# Patient Record
Sex: Male | Born: 1985 | Race: Black or African American | Hispanic: No | Marital: Single | Smoking: Never smoker
Health system: Southern US, Community
[De-identification: ages and names within clinical notes are randomized; demographics above are authoritative.]

## PROBLEM LIST (undated history)

## (undated) DIAGNOSIS — F79 Unspecified intellectual disabilities: Secondary | ICD-10-CM

## (undated) DIAGNOSIS — I10 Essential (primary) hypertension: Secondary | ICD-10-CM

## (undated) DIAGNOSIS — G4733 Obstructive sleep apnea (adult) (pediatric): Secondary | ICD-10-CM

## (undated) DIAGNOSIS — F419 Anxiety disorder, unspecified: Secondary | ICD-10-CM

## (undated) DIAGNOSIS — R32 Unspecified urinary incontinence: Secondary | ICD-10-CM

## (undated) DIAGNOSIS — F909 Attention-deficit hyperactivity disorder, unspecified type: Secondary | ICD-10-CM

## (undated) HISTORY — DX: Unspecified intellectual disabilities: F79

## (undated) HISTORY — DX: Obstructive sleep apnea (adult) (pediatric): G47.33

## (undated) HISTORY — PX: NO PAST SURGERIES: SHX2092

---

## 2009-12-26 ENCOUNTER — Ambulatory Visit: Payer: Self-pay | Admitting: Internal Medicine

## 2009-12-26 DIAGNOSIS — F71 Moderate intellectual disabilities: Secondary | ICD-10-CM

## 2009-12-26 DIAGNOSIS — G4733 Obstructive sleep apnea (adult) (pediatric): Secondary | ICD-10-CM | POA: Insufficient documentation

## 2009-12-26 DIAGNOSIS — F909 Attention-deficit hyperactivity disorder, unspecified type: Secondary | ICD-10-CM | POA: Insufficient documentation

## 2010-01-02 ENCOUNTER — Encounter: Payer: Self-pay | Admitting: Internal Medicine

## 2010-01-23 ENCOUNTER — Ambulatory Visit (HOSPITAL_BASED_OUTPATIENT_CLINIC_OR_DEPARTMENT_OTHER): Admission: RE | Admit: 2010-01-23 | Discharge: 2010-01-23 | Payer: Self-pay | Admitting: Internal Medicine

## 2010-01-25 ENCOUNTER — Ambulatory Visit: Payer: Self-pay | Admitting: Internal Medicine

## 2010-02-06 ENCOUNTER — Ambulatory Visit: Payer: Self-pay | Admitting: Internal Medicine

## 2010-02-14 ENCOUNTER — Encounter: Payer: Self-pay | Admitting: Internal Medicine

## 2010-03-02 ENCOUNTER — Encounter: Payer: Self-pay | Admitting: Internal Medicine

## 2010-03-06 ENCOUNTER — Telehealth (INDEPENDENT_AMBULATORY_CARE_PROVIDER_SITE_OTHER): Payer: Self-pay | Admitting: *Deleted

## 2010-03-07 ENCOUNTER — Ambulatory Visit: Payer: Self-pay | Admitting: Internal Medicine

## 2010-03-13 ENCOUNTER — Encounter: Payer: Self-pay | Admitting: Internal Medicine

## 2010-04-19 ENCOUNTER — Encounter: Payer: Self-pay | Admitting: Internal Medicine

## 2010-06-10 ENCOUNTER — Encounter: Payer: Self-pay | Admitting: Internal Medicine

## 2010-06-12 ENCOUNTER — Ambulatory Visit: Payer: Self-pay | Admitting: Internal Medicine

## 2010-07-18 ENCOUNTER — Ambulatory Visit: Payer: Self-pay | Admitting: Internal Medicine

## 2010-07-27 ENCOUNTER — Encounter: Payer: Self-pay | Admitting: Internal Medicine

## 2010-08-03 ENCOUNTER — Encounter: Payer: Self-pay | Admitting: Internal Medicine

## 2010-08-18 ENCOUNTER — Telehealth: Payer: Self-pay | Admitting: Internal Medicine

## 2010-08-21 ENCOUNTER — Telehealth (INDEPENDENT_AMBULATORY_CARE_PROVIDER_SITE_OTHER): Payer: Self-pay | Admitting: *Deleted

## 2010-08-26 ENCOUNTER — Ambulatory Visit: Payer: Self-pay | Admitting: Internal Medicine

## 2010-10-14 NOTE — Assessment & Plan Note (Signed)
Summary: rov 1 month///kp   CC:  Follow up visit-sleep.Marland Kitchen  History of Present Illness: History of Present Illness: December 26, 2009- 23 yoM retarded, here with his mother for question of sleep apnea on kind referral by Dr Alita Chyle. His is pleasan t and cooperative, but all hx is from mother. She says husband became concerned watching "Chad Middleton" nap. He has snored loudly all his life. Husband noted apnea and they confirm daytime sleepiness. He is also treated at [facility] by psychiatrist with dx of ADHD and anxiety. There is no hx of seizures, head trauma, or cardiopulmonary disease. No active medical disease otherwise and no hx of surgery. Bedtime 830PM, up 700AM. Unsure about waking after sleep onset. No sleep med. Little caffeine.  Feb 06, 2010- OSA, mental retardation......................Marland Kitchenmother here Came to review NPSG. He did ok with his sleep testing according to mother. NPSG- mild to mod OSA AHI 8.1/ RDI 56.8. We discussed options including no treatment in his case, cost effectiveness of treatment and goals. Mother thinks CPAP might be worth a try for his snore and health protection.  March 07, 2010- OSA, mental retardation............................Marland Kitchenmother here He has been doing very well with cpap on autotitration and the download indicated very good control and compliance. Mother says he's a little more calm and he tolerates it well. I discussed expectations and points for observation with his mother. He is not able to process the merits of CPAP himself.     Preventive Screening-Counseling & Management  Alcohol-Tobacco     Smoking Status: never  Current Medications (verified): 1)  Desmopressin Acetate 0.2 Mg Tabs (Desmopressin Acetate) .... Take 1 By Mouth At Bedtime X 30 Days 2)  Divalproex Sodium 500 Mg Tbec (Divalproex Sodium) .... Take 1 By Mouth Three Times A Day 3)  Lodrane 24 12 Mg Xr24h-Cap (Brompheniramine Maleate) .... Take  1 By Mouth Once Daily 4)  Vyvanse 50 Mg  Caps (Lisdexamfetamine Dimesylate) .... Take 1 By Mouth Once Daily 5)  Alprazolam 2 Mg Tabs (Alprazolam) .... Take 2 By Mouth  Every Morning 6)  Klonopin 2 Mg Tabs (Clonazepam) .... Take 1 By Mouth Two Times A Day 7)  Cpap 11 [facility] 8)  Clonidine Hcl 0.2 Mg Tabs (Clonidine Hcl) .... Take 2 By Mouth  Every Morning and 2 By Mouth Every Evening  Allergies (verified): No Known Drug Allergies  Past History:  Past Medical History: Last updated: 02/06/2010 Mental retardation Obstructive sleep apnea - NPSG 12/24/09- AHI 8.1/hr  Past Surgical History: Last updated: 12/26/2009 None  Family History: Last updated: 12/26/2009 Allergies-Hay fever Whole Family. Parents living  Social History: Last updated: 12/26/2009 Lives with parents Non smoker No ETOH [facility] psychiatry  Risk Factors: Smoking Status: never (03/07/2010)  Review of Systems      See HPI  The patient denies anorexia, fever, weight loss, weight gain, vision loss, decreased hearing, hoarseness, chest pain, syncope, dyspnea on exertion, peripheral edema, prolonged cough, headaches, hemoptysis, and abdominal pain.    Vital Signs:  Patient profile:   25 year old male Weight:      270 pounds O2 Sat:      99 % on Room air Pulse rate:   66 / minute BP sitting:   130 / 78  (left arm) Cuff size:   large  Vitals Entered By: Reynaldo Minium CMA (March 07, 2010 10:05 AM)  O2 Flow:  Room air CC: Follow up visit-sleep.   Physical Exam  Additional Exam:  General: A/Ox3; pleasant and cooperative, NAD,  big. Retarded, one word, slurred responses, child-like, drooling SKIN: no rash, lesions NODES: no lymphadenopathy HEENT: [place]/AT, EOM- WNL, Conjuctivae- clear, PERRLA, TM-WNL, Nose- clear, Throat- clear and wnl. Mallampati III-IV, tonsils NECK: Supple w/ fair ROM, JVD- none, normal carotid impulses w/o bruits Thyroid- normal to palpation CHEST: Clear to P&A HEART: RRR, no m/g/r heard ABDOMEN: Soft and nl; nml bowel  sounds;  UEA:VWUJ, nl pulses, no edema  NEURO: Grossly intact to observation except for mentation.       Impression & Recommendations:  Problem # 1:  OBSTRUCTIVE SLEEP APNEA (ICD-327.23)  Good initial compliance and control. we are changing over to fixed CPAP at 11 and mother understands to let us know if it is uncomfortable.  Problem # 2:  MODERATE MENTAL RETARDATION (ICD-318.0) Assessment: Comment Only  Orders: Est. Patient Level III (81191)  Medications Added to Medication List This Visit: 1)  Clonidine Hcl 0.2 Mg Tabs (Clonidine hcl) .... Take 2 by mouth  every morning and 2 by mouth every evening  Patient Instructions: 1)  Please schedule a follow-up appointment in 4 months. 2)  We are having [facility] change the CPAP setting to 11. if it is uncomfortable or doesn't seem to be working as well, let us know.

## 2010-10-14 NOTE — Assessment & Plan Note (Signed)
Summary: rov 4 months///kp   CC:  4 month follow up visit-sleep;will not wear CPAP-chaned mask and pressure.Marland Kitchen  History of Present Illness:  Feb 06, 2010- OSA, mental retardation......................Marland Kitchenmother here Came to review NPSG. He did ok with his sleep testing according to mother. NPSG- mild to mod OSA AHI 8.1/ RDI 56.8. We discussed options including no treatment in his case, cost effectiveness of treatment and goals. Mother thinks CPAP might be worth a try for his snore and health protection.  March 07, 2010- OSA, mental retardation............................Marland Kitchenmother here He has been doing very well with cpap on autotitration and the download indicated very good control and compliance. Mother says he's a little more calm and he tolerates it well. I discussed expectations and points for observation with his mother. He is not able to process the merits of CPAP himself.  June 12, 2010- OSA, mental retardation........................Marland Kitchenmother here Mother says they still put the CPAP on every night, but he pulls it off, sometimes as soon as he starts and sometimes later- for the past 3 weeks. Download in early August had shown good compliance and control at 11. Mother says they called [facility],, and someone told her husband how to adjust the ramp, but Chad Middleton sitll won't wear his mask. Mother says when he was wearing it he was less cranky. Mother asks if we could give something to relax him at bedtime, but he already gets alprazolam and Klonopin.    Preventive Screening-Counseling & Management  Alcohol-Tobacco     Smoking Status: never  Current Medications (verified): 1)  Desmopressin Acetate 0.2 Mg Tabs (Desmopressin Acetate) .... Take 1 By Mouth At Bedtime X 30 Days 2)  Divalproex Sodium 500 Mg Tbec (Divalproex Sodium) .... Take 1 By Mouth Three Times A Day 3)  Lodrane 24 12 Mg Xr24h-Cap (Brompheniramine Maleate) .... Take  1 By Mouth Once Daily 4)  Vyvanse 50 Mg Caps (Lisdexamfetamine  Dimesylate) .... Take 1 By Mouth Once Daily 5)  Alprazolam 0.5 Mg Tabs (Alprazolam) .... Take 1 By Mouth Three Times A Day As Needed Anxiety and Agitation 6)  Klonopin 2 Mg Tabs (Clonazepam) .... Take 1 By Mouth Two Times A Day 7)  Cpap 11 [facility] 8)  Clonidine Hcl 0.2 Mg Tabs (Clonidine Hcl) .... Take 2 By Mouth  Every Morning and 2 By Mouth Every Evening  Allergies (verified): No Known Drug Allergies  Past History:  Past Medical History: Last updated: 02/06/2010 Mental retardation Obstructive sleep apnea - NPSG 12/24/09- AHI 8.1/hr  Past Surgical History: Last updated: 12/26/2009 None  Family History: Last updated: 12/26/2009 Allergies-Hay fever Whole Family. Parents living  Social History: Last updated: 12/26/2009 Lives with parents Non smoker No ETOH [facility]  Risk Factors: Smoking Status: never (06/12/2010)  Review of Systems      See HPI  The patient denies shortness of breath with activity, shortness of breath at rest, productive cough, non-productive cough, coughing up blood, chest pain, irregular heartbeats, acid heartburn, indigestion, loss of appetite, weight change, abdominal pain, difficulty swallowing, sore throat, tooth/dental problems, headaches, nasal congestion/difficulty breathing through nose, sneezing, itching, ear ache, hand/feet swelling, rash, and fever.    Vital Signs:  Patient profile:   25 year old male Weight:      278.25 pounds O2 Sat:      95 % on Room air Pulse rate:   94 / minute BP sitting:   132 / 86  (left arm) Cuff size:   large  Vitals Entered By: Reynaldo Minium CMA (  June 12, 2010 10:42 AM)  O2 Flow:  Room air CC: 4 month follow up visit-sleep;will not wear CPAP-chaned mask and pressure.   Physical Exam  Additional Exam:  General: A/Ox3; pleasant and cooperative, NAD, big. Retarded, one word, slurred responses, child-like, drooling SKIN: no rash, lesions NODES: no lymphadenopathy HEENT: [place]/AT, EOM-  WNL, Conjuctivae- clear, PERRLA, TM-cerumen bilaterally, Nose- clear, Throat- clear and wnl. Mallampati III-IV, tonsils NECK: Supple w/ fair ROM, JVD- none, normal carotid impulses w/o bruits Thyroid- normal to palpation CHEST: Clear to P&A HEART: RRR, no m/g/r heard ABDOMEN: Soft and nl; nml bowel sounds;  MWN:UUVO, nl pulses, no edema  NEURO: Grossly intact to observation except for mentation.       Impression & Recommendations:  Problem # 1:  OBSTRUCTIVE SLEEP APNEA (ICD-327.23)  Chad Middleton may have learned that mask made ear uncomfortable from eustachian pressure, and now not willing to adjust and try again. I will see if we can't get him restarted using a pressure set at 10 and having parents give available alprazolam timed to calm him at bedtime.  Problem # 2:  MODERATE MENTAL RETARDATION (ICD-318.0) This is a significant barrier to his ability to cooperate for CPAP adjustment, as expected. Family is doing what they can.  Problem # 3:  ADHD (ICD-314.01) This diagnosis was made elsewhwere, prior to recognition of his OSA. In children, symptom overlap between tiredness from sleep apnea and restless inattention from ADHD has been well-known. I'm not sure that we can do much more than what has been done, unless he can get improved sleep through treatment of his OSA.  Medications Added to Medication List This Visit: 1)  Alprazolam 0.5 Mg Tabs (Alprazolam) .... Take 1 by mouth three times a day as needed anxiety and agitation  Other Orders: Est. Patient Level IV (53664) DME Referral (DME)  Patient Instructions: 1)  Please schedule a follow-up appointment in 1 month. 2)  We will get pressure reset to 10 3)  Try giving his alprazolam about a half-hour before  bedtime to see if that will relax him enough that he will cooperate to try CPAP.  4)  Maybe you can reward him- a gold star in the morning- if he keeps his CPAP on overnight.

## 2010-10-14 NOTE — Miscellaneous (Signed)
Summary: CPAP/[facility]  CPAP/[facility]   Imported By: Sherian Rein 06/20/2010 14:24:06  _____________________________________________________________________  External Attachment:    Type:   Image     Comment:   External Document

## 2010-10-14 NOTE — Progress Notes (Signed)
Summary: results  Phone Note Call from Patient Call back at Home Phone [phone]   Caller: Mom- Chad Middleton Call For: young Summary of Call: returning call from [place] re: results. call home # above or [phone] Initial call taken by: Tivis Ringer, CNA,  August 21, 2010 11:50 AM  Follow-up for Phone Call        Katie did you call this pt with results of something? Carron Curie CMA  August 21, 2010 12:20 PM     I ATC pts mother back regarding the following information:    Please let family know- his overnight oxygen scores were good on room air without CPAP. He doesn't need to use either oxygen or CPAP at this time. Per CDY.   Left a message on Cell number as Mother was not home at the time(per daughter).Reynaldo Minium CMA  August 21, 2010 3:40 PM   Additional Follow-up for Phone Call Additional follow up Details #1::        Spoke with pt's Mother Chad Middleton and notified of the above results/recs.  She verbalized understanding. Additional Follow-up by: Vernie Murders,  August 22, 2010 10:52 AM

## 2010-10-14 NOTE — Assessment & Plan Note (Signed)
Summary: rov results///kp   CC:  Follow up visit-review sleep study.  History of Present Illness:  History of Present Illness: December 26, 2009- 23 yoM retarded, here with his mother for question of sleep apnea on kind referral by Dr Alita Chyle. His is pleasan t and cooperative, but all hx is from mother. She says husband became concerned watching "Theo" nap. He has snored loudly all his life. Husband noted apnea and they confirm daytime sleepiness. He is also treated at [facility] by psychiatrist with dx of ADHD and anxiety. There is no hx of seizures, head trauma, or cardiopulmonary disease. No active medical disease otherwise and no hx of surgery. Bedtime 830PM, up 700AM. Unsure about waking after sleep onset. No sleep med. Little caffeine.  Feb 06, 2010- OSA, mental retardation......................Marland Kitchenmother here Came to review NPSG. He did ok with his sleep testing according to mother. NPSG- mild to mod OSA AHI 8.1/ RDI 56.8. We discussed options including no treatment in his case, cost effectiveness of treatment and goals. Mother thinks CPAP might be worth a try for his snore and health protection.    Preventive Screening-Counseling & Management  Alcohol-Tobacco     Smoking Status: never  Current Medications (verified): 1)  Desmopressin Acetate 0.2 Mg Tabs (Desmopressin Acetate) .... Take 1 By Mouth At Bedtime X 30 Days 2)  Divalproex Sodium 500 Mg Tbec (Divalproex Sodium) .... Take 1 By Mouth Three Times A Day 3)  Lodrane 24 12 Mg Xr24h-Cap (Brompheniramine Maleate) .... Take  1 By Mouth Once Daily 4)  Vyvanse 50 Mg Caps (Lisdexamfetamine Dimesylate) .... Take 1 By Mouth Once Daily 5)  Alprazolam 2 Mg Tabs (Alprazolam) .... Take 2 By Mouth  Every Morning 6)  Klonopin 2 Mg Tabs (Clonazepam) .... Take 1 By Mouth Two Times A Day  Allergies (verified): No Known Drug Allergies  Past History:  Past Surgical History: Last updated: 12/26/2009 None  Family History: Last  updated: 12/26/2009 Allergies-Hay fever Whole Family. Parents living  Social History: Last updated: 12/26/2009 Lives with parents Non smoker No ETOH [facility] psychiatry  Past Medical History: Mental retardation Obstructive sleep apnea - NPSG 12/24/09- AHI 8.1/hr  Social History: Smoking Status:  never  Review of Systems      See HPI  The patient denies shortness of breath with activity, shortness of breath at rest, productive cough, non-productive cough, coughing up blood, chest pain, irregular heartbeats, acid heartburn, indigestion, loss of appetite, weight change, abdominal pain, difficulty swallowing, sore throat, tooth/dental problems, headaches, nasal congestion/difficulty breathing through nose, and sneezing.    Vital Signs:  Patient profile:   25 year old male Weight:      279 pounds O2 Sat:      96 % on Room air Pulse rate:   87 / minute BP sitting:   122 / 78  (left arm) Cuff size:   large  Vitals Entered By: Reynaldo Minium CMA (Feb 06, 2010 9:36 AM)  O2 Flow:  Room air  Physical Exam  Additional Exam:  General: A/Ox3; pleasant and cooperative, NAD, big. Retarded, one word, slurred responses, child-like SKIN: no rash, lesions NODES: no lymphadenopathy HEENT: [place]/AT, EOM- WNL, Conjuctivae- clear, PERRLA, TM-WNL, Nose- clear, Throat- clear and wnl. Mallampati III-IV, tonsils NECK: Supple w/ fair ROM, JVD- none, normal carotid impulses w/o bruits Thyroid- normal to palpation CHEST: Clear to P&A HEART: RRR, no m/g/r heard ABDOMEN: Soft and nl; nml bowel sounds;  ZOX:WRUE, nl pulses, no edema  NEURO: Grossly intact to observation except  for mentation.       Impression & Recommendations:  Problem # 1:  OBSTRUCTIVE SLEEP APNEA (ICD-327.23)  He has enough to justify a trial of treatment, but I emphasized that mother shouldn't feel guilty if CPAP isn't do-able. We will order autotitration, but mother will ask about costs first. His mentation may make  cooperation difficult. A chin strap might be considered, but I doubt he could wear a mouthpiece.  Medications Added to Medication List This Visit: 1)  Klonopin 2 Mg Tabs (Clonazepam) .... Take 1 by mouth two times a day 2)  Cpap [employer]   Other Orders: Est. Patient Level IV (16109) DME Referral (DME)  Patient Instructions: 1)  Please schedule a follow-up appointment in 1 month. 2)  See [facility] about starting CPAP- It is ok to ask lots of questions.

## 2010-10-14 NOTE — Letter (Signed)
Summary: CMN for PAP Mask/[facility]  CMN for PAP Mask/[facility]   Imported By: Sherian Rein 06/18/2010 08:28:01  _____________________________________________________________________  External Attachment:    Type:   Image     Comment:   External Document

## 2010-10-14 NOTE — Assessment & Plan Note (Signed)
Summary: 1 month follow up//jwr   CC:  1 month follow up visit-not sleep walking like before pre parents and attempting to use CPAP more.Marland Kitchen  History of Present Illness:  March 07, 2010- OSA, mental retardation............................Marland Kitchenmother here He has been doing very well with cpap on autotitration and the download indicated very good control and compliance. Mother says he's a little more calm and he tolerates it well. I discussed expectations and points for observation with his mother. He is not able to process the merits of CPAP himself.  June 12, 2010- OSA, mental retardation........................Marland Kitchenmother here Mother says they still put the CPAP on every night, but he pulls it off, sometimes as soon as he starts and sometimes later- for the past 3 weeks. Download in early August had shown good compliance and control at 11. Mother says they called [facility],, and someone told her husband how to adjust the ramp, but Theo sitll won't wear his mask. Mother says when he was wearing it he was less cranky. Mother asks if we could give something to relax him at bedtime, but he already gets alprazolam and Klonopin.  July 18, 2010- OSA, mental retardation........................Marland Kitchenmother and father here Nurse-CC: 1 month follow up visit-not sleep walking like before pre parents and attempting to use CPAP more. He will let them put his CPAP on, but he takes it off early in the night and wants to sleep on his belly. He isn't capable of explaining what bothers him, but says mask doesn't hurt. His AHI was minimal and we discussed real option not to treat.      Preventive Screening-Counseling & Management  Alcohol-Tobacco     Smoking Status: never  Current Medications (verified): 1)  Divalproex Sodium 500 Mg Tbec (Divalproex Sodium) .... Take 4 By Mouth At Bedtime 2)  Vyvanse 50 Mg Caps (Lisdexamfetamine Dimesylate) .... Take 1 By Mouth Once Daily 3)  Alprazolam 2 Mg Tabs (Alprazolam) ....  Take 2 By Mouth Every Morning 4)  Cpap 11 [facility] 5)  Loratadine 10 Mg Tabs (Loratadine) .... Take 1 By Mouth Once Daily 6)  Saphris 5 Mg Subl (Asenapine Maleate) .Marland Kitchen.. 1 Sl Two Times A Day 7)  Mucus Relief Dm 20-400 Mg Tabs (Dextromethorphan-Guaifenesin) .... Take 1 By Mouth Every 4 Hours As Needed  Allergies (verified): No Known Drug Allergies  Past History:  Past Medical History: Last updated: 02/06/2010 Mental retardation Obstructive sleep apnea - NPSG 12/24/09- AHI 8.1/hr  Past Surgical History: Last updated: 12/26/2009 None  Family History: Last updated: 12/26/2009 Allergies-Hay fever Whole Family. Parents living  Social History: Last updated: 12/26/2009 Lives with parents Non smoker No ETOH [facility]  Risk Factors: Smoking Status: never (07/18/2010)  Review of Systems      See HPI  The patient denies anorexia, fever, weight loss, weight gain, vision loss, decreased hearing, hoarseness, chest pain, syncope, dyspnea on exertion, peripheral edema, prolonged cough, headaches, hemoptysis, abdominal pain, severe indigestion/heartburn, muscle weakness, unusual weight change, abnormal bleeding, and angioedema.    Vital Signs:  Patient profile:   25 year old male Weight:      278.50 pounds O2 Sat:      98 % on Room air Pulse rate:   95 / minute BP sitting:   126 / 94  (left arm) Cuff size:   large  Vitals Entered By: Reynaldo Minium CMA (July 18, 2010 4:42 PM)  O2 Flow:  Room air CC: 1 month follow up visit-not sleep walking like before pre parents and  attempting to use CPAP more.   Physical Exam  Additional Exam:  General: A/Ox3; pleasant and cooperative, NAD, big. Retarded, one word, slurred responses, child-like, drooling SKIN: no rash, lesions NODES: no lymphadenopathy HEENT: [place]/AT, EOM- WNL, Conjuctivae- clear, PERRLA, TM-cerumen bilaterally, Nose- clear, Throat- clear and wnl. Mallampati III-IV, tonsils NECK: Supple w/ fair ROM, JVD-  none, normal carotid impulses w/o bruits Thyroid- normal to palpation CHEST: Clear to P&A HEART: RRR, no m/g/r heard ABDOMEN: Soft and nl; nml bowel sounds;  ZOX:WRUE, nl pulses, no edema  NEURO: Grossly intact to observation except for mentation.       Impression & Recommendations:  Problem # 1:  OBSTRUCTIVE SLEEP APNEA (ICD-327.23)  His original AHI was only 8.1/hr and he didn't desaturate. We discussed available treatments and I made the point that he may not be served importantly by treatment and may be ok untreated for now. We will recheck oxygen saturation at night on room air and may chose to stop treatment. Discussed chin straps.   Medications Added to Medication List This Visit: 1)  Divalproex Sodium 500 Mg Tbec (Divalproex sodium) .... Take 4 by mouth at bedtime 2)  Alprazolam 2 Mg Tabs (Alprazolam) .... Take 2 by mouth every morning 3)  Loratadine 10 Mg Tabs (Loratadine) .... Take 1 by mouth once daily 4)  Saphris 5 Mg Subl (Asenapine maleate) .Marland Kitchen.. 1 sl two times a day 5)  Mucus Relief Dm 20-400 Mg Tabs (Dextromethorphan-guaifenesin) .... Take 1 by mouth every 4 hours as needed  Other Orders: Est. Patient Level III (45409) DME Referral (DME) Flu Vaccine 66yrs + (81191) Admin 1st Vaccine (47829)  Patient Instructions: 1)  Please schedule a follow-up appointment in 1 month. 2)  See [facility] to arrange overnight oximetry on room air without CPAP 3)  You can look at chin straps for snoring, and the boil and bite type mouthpieces used for sports, to keep his jaw from dropping back.  4)  Flu vax   Immunizations Administered:  Influenza Vaccine # 1:    Vaccine Type: Fluvax 3+    Site: left deltoid    Mfr:  [employer]    Dose: 0.5 ml    Route: IM    Given by: Vivianne Spence    Exp. Date: 02/13/2011    Lot #: [id number]    VIS given: 04/08/10 version given July 21, 2010.  Flu Vaccine Consent Questions:    Do you have a history of severe allergic reactions to this  vaccine? no    Any prior history of allergic reactions to egg and/or gelatin? no    Do you have a sensitivity to the preservative Thimersol? no    Do you have a past history of Guillan-Barre Syndrome? no    Do you currently have an acute febrile illness? no    Have you ever had a severe reaction to latex? no    Vaccine information given and explained to patient? yes

## 2010-10-14 NOTE — Letter (Signed)
Summary: CMN/[facility]  CMN/[facility]   Imported By: Lester [place] 08/11/2010 09:41:01  _____________________________________________________________________  External Attachment:    Type:   Image     Comment:   External Document

## 2010-10-14 NOTE — Letter (Signed)
Summary: CMN for CPAP unit & supplies/[facility]  CMN for CPAP unit & supplies/[facility]   Imported By: Sherian Rein 03/24/2010 09:26:55  _____________________________________________________________________  External Attachment:    Type:   Image     Comment:   External Document

## 2010-10-14 NOTE — Assessment & Plan Note (Signed)
Summary: SLEEP CONSULT/ MBW   CC:  Sleep Consult-Dr. Alita Chyle; no sleep study..  History of Present Illness: December 26, 2009- 25 yoM retarded, here with his mother for question of sleep apnea on kind referral by Dr Alita Chyle. His is pleasan t and cooperative, but all hx is from mother. She says husband became concerned watching "Theo" nap. He has snored loudly all his life. Husband noted apnea and they confirm daytime sleepiness. He is also treated at [facility] by psychiatrist with dx of ADHD and anxiety. There is no hx of seizures, head trauma, or cardiopulmonary disease. No active medical disease otherwise and no hx of surgery. Bedtime 830PM, up 700AM. Unsure about waking after sleep onset. No sleep med. Little caffeine.  Current Medications (verified): 1)  Desmopressin Acetate 0.2 Mg Tabs (Desmopressin Acetate) .... Take 1 By Mouth At Bedtime X 30 Days 2)  Divalproex Sodium 500 Mg Tbec (Divalproex Sodium) .... Take 1 By Mouth Three Times A Day 3)  Lodrane 24 12 Mg Xr24h-Cap (Brompheniramine Maleate) .... Take  1 By Mouth Once Daily 4)  Vyvanse 50 Mg Caps (Lisdexamfetamine Dimesylate) .... Take 1 By Mouth Once Daily 5)  Alprazolam 2 Mg Tabs (Alprazolam) .... Take 2 By Mouth  Every Morning  Allergies (verified): No Known Drug Allergies  Past History:  Family History: Last updated: 12/26/2009 Allergies-Hay fever Whole Family. Parents living  Social History: Last updated: 12/26/2009 Lives with parents Non smoker No ETOH [facility] psychiatry  Past Medical History: Mental retardation ? obstructive sleep apnea  Past Surgical History: None  Family History: Allergies-Hay fever Whole Family. Parents living  Social History: Lives with parents Non smoker No ETOH [facility] psychiatry  Review of Systems      See HPI       The patient complains of shortness of breath with activity, shortness of breath at rest, nasal congestion/difficulty breathing through  nose, and anxiety.  The patient denies productive cough, non-productive cough, coughing up blood, chest pain, irregular heartbeats, acid heartburn, indigestion, loss of appetite, weight change, abdominal pain, difficulty swallowing, sore throat, tooth/dental problems, headaches, sneezing, itching, depression, hand/feet swelling, joint stiffness or pain, rash, change in color of mucus, and fever.    Vital Signs:  Patient profile:   25 year old male Weight:      288.50 pounds O2 Sat:      95 % on Room air Pulse rate:   88 / minute BP sitting:   136 / 70  (left arm) Cuff size:   large  Vitals Entered By: Reynaldo Minium CMA (December 26, 2009 9:14 AM)  O2 Flow:  Room air  Physical Exam  Additional Exam:  General: A/Ox3; pleasant and cooperative, NAD, big. Retarded, one word, slurred responses SKIN: no rash, lesions NODES: no lymphadenopathy HEENT: [place]/AT, EOM- WNL, Conjuctivae- clear, PERRLA, TM-WNL, Nose- clear, Throat- clear and wnl. Mallampati III-IV, tonsils NECK: Supple w/ fair ROM, JVD- none, normal carotid impulses w/o bruits Thyroid- normal to palpation CHEST: Clear to P&A HEART: RRR, no m/g/r heard ABDOMEN: Soft and nl; nml bowel sounds;  LKG:MWNU, nl pulses, no edema  NEURO: Grossly intact to observation except for mentation.       Impression & Recommendations:  Problem # 1:  OTHER APNEA OF NEWBORN (ICD-770.82) Exam and hx make OSA likely. We discussed process of evaluation, medical aspects of sleep apnea and answered questions. He is cooperative now, but not likely to understand complex instruction. We will order a split protocl sleep study. A parent  will need to stay with him.  Medications Added to Medication List This Visit: 1)  Desmopressin Acetate 0.2 Mg Tabs (Desmopressin acetate) .... Take 1 by mouth at bedtime x 30 days 2)  Divalproex Sodium 500 Mg Tbec (Divalproex sodium) .... Take 1 by mouth three times a day 3)  Lodrane 24 12 Mg Xr24h-cap (Brompheniramine maleate)  .... Take  1 by mouth once daily 4)  Vyvanse 50 Mg Caps (Lisdexamfetamine dimesylate) .... Take 1 by mouth once daily 5)  Alprazolam 2 Mg Tabs (Alprazolam) .... Take 2 by mouth  every morning  Other Orders: Consultation Level III (54270) Sleep Disorder Referral (Sleep Disorder)  Patient Instructions: 1)  Please schedule a follow-up appointment in 1 month. 2)  See [facility] to schedule sleep study

## 2010-10-14 NOTE — Progress Notes (Signed)
Summary: Download CPAP to 11 cwp  Phone Note Other Incoming   Summary of Call: [facility]- download cpap good compliance and control at 11 cwp  Follow-up for Phone Call        order given to [facility] to set cpap@11cm  Follow-up by: Oneita Jolly,  March 07, 2010 9:10 AM    New/Updated Medications: * CPAP 11 [facility]

## 2010-10-14 NOTE — Progress Notes (Signed)
Summary: ONOX on room air good- no need for home O2.  Phone Note Other Incoming   Summary of Call: Overnight oximetry- room air/ no CPAP- good oxygenation. He does not need oxygen.  Initial call taken by: Waymon Budge MD,  August 18, 2010 12:59 PM

## 2010-10-14 NOTE — Procedures (Signed)
Summary: Oximetrry/[facility]  Oximetrry/[facility]   Imported By: Lester Worthington 08/22/2010 11:27:03  _____________________________________________________________________  External Attachment:    Type:   Image     Comment:   External Document

## 2010-10-16 NOTE — Assessment & Plan Note (Signed)
Summary: 1 month/cb   CC:  1 month follow up visit-sleep not using CPAP machine at all; getting up at night at times..  History of Present Illness:  June 12, 2010- OSA, mental retardation........................Marland Kitchenmother here Mother says they still put the CPAP on every night, but he pulls it off, sometimes as soon as he starts and sometimes later- for the past 3 weeks. Download in early August had shown good compliance and control at 11. Mother says they called [facility],, and someone told her husband how to adjust the ramp, but Theo sitll won't wear his mask. Mother says when he was wearing it he was less cranky. Mother asks if we could give something to relax him at bedtime, but he already gets alprazolam and Klonopin.  July 18, 2010- OSA, mental retardation........................Marland Kitchenmother and father here Nurse-CC: 1 month follow up visit-not sleep walking like before pre parents and attempting to use CPAP more. He will let them put his CPAP on, but he takes it off early in the night and wants to sleep on his belly. He isn't capable of explaining what bothers him, but says mask doesn't hurt. His AHI was minimal and we discussed real option not to treat.    August 26, 2010-  OSA, mental retardation...mother here Nurse-CC: 1 month follow up visit-sleep not using CPAP machine at all; getting up at night at times. We discussed his adequate oxygen levels and reviewed his ONOX, such that he is better off not having to try to keep oxygen or cpap on. He has difficulty keeping hands off appliances.  Mother has seen some twitching in sleep, but not sustained and not w/ bed wetting or tongue biting. he is not sleep walking.     Preventive Screening-Counseling & Management  Alcohol-Tobacco     Smoking Status: never  Current Medications (verified): 1)  Divalproex Sodium 500 Mg Tbec (Divalproex Sodium) .... Take 4 By Mouth At Bedtime 2)  Vyvanse 50 Mg Caps (Lisdexamfetamine Dimesylate) ....  Take 1 By Mouth Once Daily 3)  Alprazolam 2 Mg Tabs (Alprazolam) .... Take 2 By Mouth Every Morning 4)  Cpap 11 [facility] 5)  Loratadine 10 Mg Tabs (Loratadine) .... Take 1 By Mouth Once Daily 6)  Saphris 5 Mg Subl (Asenapine Maleate) .Marland Kitchen.. 1 Sl Two Times A Day 7)  Mucus Relief Dm 20-400 Mg Tabs (Dextromethorphan-Guaifenesin) .... Take 1 By Mouth Every 4 Hours As Needed  Allergies (verified): No Known Drug Allergies  Past History:  Past Medical History: Last updated: 02/06/2010 Mental retardation Obstructive sleep apnea - NPSG 12/24/09- AHI 8.1/hr  Past Surgical History: Last updated: 12/26/2009 None  Family History: Last updated: 12/26/2009 Allergies-Hay fever Whole Family. Parents living  Social History: Last updated: 12/26/2009 Lives with parents Non smoker No ETOH [facility] psychiatry  Risk Factors: Smoking Status: never (08/26/2010)  Review of Systems      See HPI  The patient denies shortness of breath with activity, shortness of breath at rest, productive cough, non-productive cough, coughing up blood, chest pain, irregular heartbeats, acid heartburn, indigestion, loss of appetite, weight change, abdominal pain, difficulty swallowing, sore throat, nasal congestion/difficulty breathing through nose, rash, and fever.    Vital Signs:  Patient profile:   25 year old male Weight:      276.50 pounds O2 Sat:      95 % on Room air Pulse rate:   136 / minute BP sitting:   126 / 80  (left arm) Cuff size:   large  Vitals  Entered By: Reynaldo Minium CMA (August 26, 2010 9:47 AM)  O2 Flow:  Room air CC: 1 month follow up visit-sleep not using CPAP machine at all; getting up at night at times.   Physical Exam  Additional Exam:  General: A/Ox3; pleasant and cooperative, NAD, big. Retarded, one word, slurred responses, child-like, drooling SKIN: no rash, lesions NODES: no lymphadenopathy HEENT: [place]/AT, EOM- WNL, Conjuctivae- clear, PERRLA, TM-cerumen bilaterally,  Nose- clear, Throat- clear and wnl. Mallampati III-IV, tonsils NECK: Supple w/ fair ROM, JVD- none, normal carotid impulses w/o bruits Thyroid- normal to palpation CHEST: Clear to P&A HEART: RRR, no m/g/r heard ABDOMEN: Soft and nl; nml bowel sounds;  ZOX:WRUE, nl pulses, no edema  NEURO: Grossly intact to observation except for mentation.       Impression & Recommendations:  Problem # 1:  OBSTRUCTIVE SLEEP APNEA (ICD-327.23)  He has minimal apnea and no significant sleep -disordered breathing or desaturation. and is  better off without intervention at this time.   Other Orders: Est. Patient Level III (45409) DME Referral (DME)  Patient Instructions: 1)  Please schedule a follow-up appointment as needed.

## 2011-06-26 ENCOUNTER — Ambulatory Visit (INDEPENDENT_AMBULATORY_CARE_PROVIDER_SITE_OTHER): Payer: Medicaid Other | Admitting: Internal Medicine

## 2011-06-26 ENCOUNTER — Encounter: Payer: Self-pay | Admitting: Internal Medicine

## 2011-06-26 VITALS — BP 128/90 | HR 118 | Wt 314.6 lb

## 2011-06-26 DIAGNOSIS — G4733 Obstructive sleep apnea (adult) (pediatric): Secondary | ICD-10-CM

## 2011-06-26 MED ORDER — PROTRIPTYLINE HCL 5 MG PO TABS: 5.0000 mg | ORAL_TABLET | Freq: Every day | ORAL | Status: DC

## 2011-06-26 NOTE — Patient Instructions (Addendum)
Suggest you look at drug store for: 1) a chin strap that he could try wearing at night to keep his mouth closed during sleep 2) a boil and bite type mouth piece like ones kids use for football and soccer. He could wear this at night to keep his jaw in position to reduce snoring.  Try vivactil/ protriptyline  - This sometimes helps with sleep apnea. I would want Dr Ladona Ridgel to be okay with having Marino try it for awhile. Looking for it to reduce snoring.

## 2011-06-26 NOTE — Progress Notes (Signed)
06/26/11- 25 year old medically retarded male with history of obstructive sleep apnea complicated by ADHD. Mother here. Last here- 08/26/2010. PCP Dr Alita Chyle When we had last seen him, he had failed trials of CPAP with several masks, and home oxygen for sleep. The problem was that he would not leave them alone and would not leave them on long enough to be helpful at night. CPAP had been set at 11 CWP/[facility]. We had recorded overnight oximetry which showed minimal sleep-related desaturation. On that basis we decided he was better off left alone. Unfortunately since then he has gained quite a bit of weight, possibly because of his Depakote therapy. Mother says he demands food. His psychiatrist, Dr. Carolanne Grumbling, had said that some of his daytime problems could have been because of insufficient sleep. Mother says he snores loudly, tosses and turns. He does not sleep in the daytime.Vyvanse seemed to reduce his appetite at first, but no longer.  ROS See HPI Constitutional:   No-   weight loss, night sweats, fevers, chills, fatigue, lassitude. HEENT:   No-  headaches, difficulty swallowing, tooth/dental problems, sore throat,       No-  sneezing, itching, ear ache, nasal congestion, post nasal drip,  CV:  No-   chest pain, orthopnea, PND, swelling in lower extremities, anasarca, dizziness, palpitations Resp: No-   shortness of breath with exertion or at rest.              No-   productive cough,  No non-productive cough,  No-  coughing up of blood.              No-   change in color of mucus.  No- wheezing.   Skin: No-   rash or lesions. GI:  No-   heartburn, indigestion, abdominal pain, nausea, vomiting, diarrhea,                 change in bowel habits, loss of appetite GU: No-   dysuria, change in color of urine, no urgency or frequency.  No- flank pain. MS:  No-   joint pain or swelling.  No- decreased range of motion.  No- back pain. Neuro- grossly normal to observation, Or:  Psych:  No-  change in his usual mood or affect. No depression or anxiety.  No memory loss.  General- Alert, Oriented, Affect-appropriate, Distress- none acute. Big man, overweight. He is holding a magazine upside down and has to be reminded to close his mouth and swallow so he doesn't drool on it. Skin- rash-none, lesions- none, excoriation- none Lymphadenopathy- none Head- atraumatic            Eyes- Gross vision intact, PERRLA, conjunctivae clear secretions            Ears- Hearing, canals-normal            Nose- Clear, no-Septal dev, mucus, polyps, erosion, perforation             Throat- Mallampati III , mucosa clear , drainage- none, tonsils- atrophic Neck- flexible , trachea midline, no stridor , thyroid nl, carotid no bruit Chest - symmetrical excursion , unlabored           Heart/CV- RRR , no murmur , no gallop  , no rub, nl s1 s2                           - JVD- none , edema- none, stasis changes- none, varices- none  Lung- clear to P&A, wheeze- none, cough- none , dullness-none, rub- none. He mostly was breathing with mouth closed.           Chest wall-  Abd- tender-no, distended-no, bowel sounds-present, HSM- no Br/ Gen/ Rectal- Not done, not indicated Extrem- cyanosis- none, clubbing, none, atrophy- none, strength- nl Neuro- grossly intact to observation

## 2011-06-28 ENCOUNTER — Encounter: Payer: Self-pay | Admitting: Internal Medicine

## 2011-06-28 NOTE — Assessment & Plan Note (Addendum)
Weight gain is apt to make sleep apnea worse. We realize the difficulty here. We were never able to learn from Chad Middleton but was uncomfortable about CPAP he would not leave it alone in his sleep and could not keep it on despite several mask changes. He was the same with nasal oxygen. If he could breathe with his mouth closed possibly he could manage with a chin strap or simple "boil and bite" type mouthpiece. These would hold his jaw in normal position and reduce the chance that his tongue would drop back. I discussed these options with his mother. I don't really mind trying again with CPAP if necessary but I see nothing to make me think it would be any different this time around.  Some people over the years have seemed to benefit from Vivactil, noted in some circumstances to reduce snoring. This is a stimulant antidepressant but patients on whom I have tried it have not usually experienced significant insomnia. A few have felt, supported by bed partners, that it did reduce snoring and witnessed apnea. If it doesn't interfere with his other therapies, it might be worth a try because at least it would be simple.

## 2011-08-11 ENCOUNTER — Other Ambulatory Visit: Payer: Self-pay | Admitting: Internal Medicine

## 2011-08-26 NOTE — Telephone Encounter (Signed)
Ok to refill for 6 months 

## 2011-08-26 NOTE — Telephone Encounter (Signed)
Please advise if okay to refill. Thanks.  

## 2011-09-22 ENCOUNTER — Emergency Department (HOSPITAL_COMMUNITY): Payer: Medicaid Other

## 2011-09-22 ENCOUNTER — Inpatient Hospital Stay (HOSPITAL_COMMUNITY)
Admission: EM | Admit: 2011-09-22 | Discharge: 2011-09-25 | DRG: 153 | Disposition: A | Discharge status: Home / Self Care | Payer: Medicaid Other | Attending: Internal Medicine | Admitting: Internal Medicine

## 2011-09-22 ENCOUNTER — Encounter (HOSPITAL_COMMUNITY): Payer: Self-pay | Admitting: *Deleted

## 2011-09-22 ENCOUNTER — Other Ambulatory Visit: Payer: Self-pay

## 2011-09-22 DIAGNOSIS — R410 Disorientation, unspecified: Secondary | ICD-10-CM | POA: Diagnosis present

## 2011-09-22 DIAGNOSIS — R6510 Systemic inflammatory response syndrome (SIRS) of non-infectious origin without acute organ dysfunction: Secondary | ICD-10-CM

## 2011-09-22 DIAGNOSIS — F71 Moderate intellectual disabilities: Secondary | ICD-10-CM | POA: Diagnosis present

## 2011-09-22 DIAGNOSIS — G4733 Obstructive sleep apnea (adult) (pediatric): Secondary | ICD-10-CM | POA: Diagnosis present

## 2011-09-22 DIAGNOSIS — J392 Other diseases of pharynx: Secondary | ICD-10-CM

## 2011-09-22 DIAGNOSIS — F05 Delirium due to known physiological condition: Secondary | ICD-10-CM | POA: Diagnosis present

## 2011-09-22 DIAGNOSIS — F919 Conduct disorder, unspecified: Secondary | ICD-10-CM | POA: Diagnosis present

## 2011-09-22 DIAGNOSIS — G4700 Insomnia, unspecified: Secondary | ICD-10-CM

## 2011-09-22 DIAGNOSIS — J040 Acute laryngitis: Principal | ICD-10-CM | POA: Diagnosis present

## 2011-09-22 DIAGNOSIS — F909 Attention-deficit hyperactivity disorder, unspecified type: Secondary | ICD-10-CM | POA: Diagnosis present

## 2011-09-22 HISTORY — DX: Attention-deficit hyperactivity disorder, unspecified type: F90.9

## 2011-09-22 HISTORY — DX: Unspecified urinary incontinence: R32

## 2011-09-22 HISTORY — DX: Anxiety disorder, unspecified: F41.9

## 2011-09-22 LAB — CBC
HCT: 42.6 % (ref 39.0–52.0)
Hemoglobin: 14.4 g/dL (ref 13.0–17.0)
MCH: 30.5 pg (ref 26.0–34.0)
MCHC: 33.8 g/dL (ref 30.0–36.0)

## 2011-09-22 LAB — COMPREHENSIVE METABOLIC PANEL
BUN: 11 mg/dL (ref 6–23)
Calcium: 9.9 mg/dL (ref 8.4–10.5)
Creatinine, Ser: 0.88 mg/dL (ref 0.50–1.35)
GFR calc Af Amer: >90 mL/min (ref >90)
GFR calc non Af Amer: >90 mL/min (ref >90)
Glucose, Bld: 90 mg/dL (ref 70–99)
Total Protein: 8.2 g/dL (ref 6.0–8.3)

## 2011-09-22 LAB — DIFFERENTIAL
Basophils Relative: 0 % (ref 0–1)
Eosinophils Absolute: 0 10*3/uL (ref 0.0–0.7)
Monocytes Absolute: 0.4 10*3/uL (ref 0.1–1.0)
Monocytes Relative: 7 % (ref 3–12)
Neutro Abs: 3.8 10*3/uL (ref 1.7–7.7)

## 2011-09-22 LAB — URINE MICROSCOPIC-ADD ON

## 2011-09-22 LAB — URINE CULTURE
Colony Count: NO GROWTH
Culture  Setup Time: 201301090203
Culture: NO GROWTH

## 2011-09-22 LAB — RAPID URINE DRUG SCREEN, HOSP PERFORMED
Benzodiazepines: POSITIVE — AB
Cocaine: NOT DETECTED

## 2011-09-22 LAB — URINALYSIS, ROUTINE W REFLEX MICROSCOPIC
Glucose, UA: NEGATIVE mg/dL
Hgb urine dipstick: NEGATIVE
Protein, ur: 30 mg/dL — AB

## 2011-09-22 LAB — AMMONIA: Ammonia: 61 umol/L — ABNORMAL HIGH (ref 11–60)

## 2011-09-22 LAB — CULTURE, BLOOD (ROUTINE X 2)
Culture  Setup Time: 201301090222
Culture: NO GROWTH

## 2011-09-22 LAB — MONONUCLEOSIS SCREEN: Mono Screen: NEGATIVE

## 2011-09-22 MED ORDER — SODIUM CHLORIDE 0.9 % IV SOLN
3.0000 g | Freq: Once | INTRAVENOUS | Status: AC
  Administered 2011-09-22: 3 g via INTRAVENOUS
  Filled 2011-09-22: qty 3

## 2011-09-22 MED ORDER — RACEPINEPHRINE HCL 2.25 % IN NEBU
0.5000 mL | INHALATION_SOLUTION | Freq: Once | RESPIRATORY_TRACT | Status: AC
Start: 2011-09-22 — End: 2011-09-22
  Administered 2011-09-22: 0.5 mL via RESPIRATORY_TRACT
  Filled 2011-09-22: qty 0.5

## 2011-09-22 MED ORDER — SODIUM CHLORIDE 0.9 % IV SOLN
Freq: Once | INTRAVENOUS | Status: AC
  Administered 2011-09-22: 23:00:00 via INTRAVENOUS

## 2011-09-22 MED ORDER — DEXAMETHASONE SODIUM PHOSPHATE 10 MG/ML IJ SOLN
10.0000 mg | Freq: Once | INTRAMUSCULAR | Status: AC
  Administered 2011-09-22: 10 mg via INTRAVENOUS
  Filled 2011-09-22: qty 1

## 2011-09-22 MED ORDER — IOHEXOL 300 MG/ML  SOLN
100.0000 mL | Freq: Once | INTRAMUSCULAR | Status: AC | PRN
  Administered 2011-09-22: 100 mL via INTRAVENOUS

## 2011-09-22 MED ORDER — SODIUM CHLORIDE 0.9 % IV BOLUS (SEPSIS)
1000.0000 mL | Freq: Once | INTRAVENOUS | Status: AC
  Administered 2011-09-22: 1000 mL via INTRAVENOUS

## 2011-09-22 NOTE — ED Notes (Signed)
Patient transported to CT 

## 2011-09-22 NOTE — ED Provider Notes (Addendum)
History     CSN: [id number]  Arrival date & time 09/22/11  8295   First MD Initiated Contact with Patient 09/22/11 1959      Chief Complaint  Patient presents with  . Altered Mental Status    (Consider location/radiation/quality/duration/timing/severity/associated sxs/prior treatment) HPI Comments: The history comes from the mother and father as the patient himself is not talking at this time and also has developmental delay which inhibits the history.  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  Since yesterday he started being more "whiny" and feeling less well.  Today they note he's got generalized weakness and is unable to stand.  He seems to be coughing as well.  They do not note any specific fevers.  Denies any nausea, vomiting, diarrhea or abdominal pain.  No specific sick contacts.  They also report that he would not tell you if he was getting sick and with what symptoms so that also inhibits the family's ability to give history.  Patient is a 26 y.o. male presenting with altered mental status. The history is provided by a parent. The history is limited by a developmental delay. No language interpreter was used.  Altered Mental Status This is a new problem. The current episode started yesterday. The problem occurs constantly. The problem has been gradually worsening. Pertinent negatives include no chest pain, no abdominal pain, no headaches and no shortness of breath.    Past Medical History  Diagnosis Date  . Mental retardation   . OSA (obstructive sleep apnea)   . ADHD (attention deficit hyperactivity disorder)   . Anxiety     History reviewed. No pertinent past surgical history.  History reviewed. No pertinent family history.  History  Substance Use Topics  . Smoking status: Never Smoker   . Smokeless tobacco: Not on file  . Alcohol Use: No      Review of  Systems  Unable to perform ROS HENT:       Parents note that he drools at baseline but appears to be having some excessive drooling today  Respiratory: Negative for shortness of breath.   Cardiovascular: Negative for chest pain.  Gastrointestinal: Negative for nausea, vomiting, abdominal pain and diarrhea.  Skin: Negative for rash.  Neurological: Positive for weakness. Negative for headaches.  Psychiatric/Behavioral: Positive for altered mental status.    Allergies  Review of patient's allergies indicates no known allergies.  Home Medications   Current Outpatient Rx  Name Route Sig Dispense Refill  . ALPRAZOLAM 2 MG PO TABS  Take 2 tablets by mouth every morning     . ASENAPINE MALEATE 5 MG SL SUBL Sublingual Place 5 mg under the tongue 2 (two) times daily.      . DESMOPRESSIN ACETATE 0.2 MG PO TABS Oral Take 0.2 mg by mouth at bedtime.      Marland Kitchen DEXTROMETHORPHAN-GUAIFENESIN 20-400 MG PO TABS Oral Take 1 tablet by mouth every 4 (four) hours as needed.      Marland Kitchen DIVALPROEX SODIUM ER 500 MG PO TB24  500 mg. Take 3 tablets by mouth at bedtime    . LISDEXAMFETAMINE DIMESYLATE 50 MG PO CAPS Oral Take 50 mg by mouth every morning.      Marland Kitchen LORATADINE 10 MG PO TABS Oral Take 10 mg by mouth daily.      Marland Kitchen PROTRIPTYLINE HCL 5 MG PO TABS  TAKE 1 TABLET AT BEDTIME FOR  SNORING 15 tablet 5  . SERTRALINE HCL 25 MG PO TABS Oral Take 25 mg by mouth daily.        BP 148/71  Pulse 102  Temp(Src) 99.4 F (37.4 C) (Rectal)  Resp 18  Ht 6\' 2"  (1.88 m)  Wt 318 lb (144.244 kg)  BMI 40.83 kg/m2  SpO2 100%  Physical Exam  Constitutional: He appears well-developed and well-nourished.  Non-toxic appearance. He does not have a sickly appearance.  HENT:  Head: Normocephalic and atraumatic.       Tongue is not swollen sublingual tissues appear soft.  Uvula is midline with no peritonsillar swelling, exudates or edema.  Eyes: Conjunctivae, EOM and lids are normal. Pupils are equal, round, and reactive to light.    Neck: Trachea normal, normal range of motion and full passive range of motion without pain. Neck supple.       No nuchal rigidity  Cardiovascular: Regular rhythm, S1 normal, S2 normal and normal heart sounds.  Tachycardia present.   Pulmonary/Chest: Effort normal and breath sounds normal. No respiratory distress. He has no wheezes. He has no rales.  Abdominal: Soft. Normal appearance. He exhibits no distension. There is no tenderness. There is no rebound and no CVA tenderness.  Musculoskeletal: Normal range of motion.  Lymphadenopathy:    He has no cervical adenopathy.  Neurological: He is alert. He has normal strength.  Skin: Skin is warm, dry and intact. No rash noted.  Psychiatric:       Unable to assess due to patient not talking at this time.  He also has moderate mental retardation per parent report    ED Course  Procedures (including critical care time)  Labs Reviewed  URINALYSIS, ROUTINE W REFLEX MICROSCOPIC - Abnormal; Notable for the following:    Color, Urine AMBER (*) BIOCHEMICALS MAY BE AFFECTED BY COLOR   Specific Gravity, Urine 1.034 (*)    Bilirubin Urine SMALL (*)    Ketones, ur 15 (*)    Protein, ur 30 (*)    All other components within normal limits  AMMONIA - Abnormal; Notable for the following:    Ammonia 61 (*)    All other components within normal limits  GLUCOSE, CAPILLARY  CBC  DIFFERENTIAL  LACTIC ACID, PLASMA  COMPREHENSIVE METABOLIC PANEL  ETHANOL  URINE MICROSCOPIC-ADD ON  VALPROIC ACID LEVEL  URINE CULTURE  CULTURE, BLOOD (ROUTINE X 2)  CULTURE, BLOOD (ROUTINE X 2)  PROCALCITONIN  URINE RAPID DRUG SCREEN (HOSP PERFORMED)   Dg Neck Soft Tissue  09/22/2011  *RADIOLOGY REPORT*  Clinical Data: Altered mental status.  Wheezing.  NECK SOFT TISSUES - 1+ VIEW  Comparison: None.  Findings: The epiglottis is not well delineated on the present exam.  The hypopharynx does not appear dilated.  Prominence of soft tissue in the upper posterior-superior  nasopharynx may represent adenoidal tissue. If further delineation is clinically desired, CT neck can be performed.  Question of small laryngocele.  IMPRESSION: The epiglottis is not well delineated on the present exam.  The hypopharynx does not appear dilated.  Prominence of soft tissue in the upper posterior-superior nasopharynx may represent adenoidal tissue.  Question of small laryngocele.  Original Report Authenticated By: Fuller Canada, M.D.   Dg Chest 1 View  09/22/2011  *RADIOLOGY REPORT*  Clinical Data: Altered mental status.  Tachycardia.  CHEST - 1 VIEW  Comparison: None.  Findings: Poor inspiration with elevated left hemidiaphragm limiting evaluation of the left base.  Heart size within normal limits.  No pneumothorax or pulmonary edema.  IMPRESSION: Elevated left hemidiaphragm limiting evaluation of the left base.  Original Report Authenticated By: Fuller Canada, M.D.   Ct Head Wo Contrast  09/22/2011  *RADIOLOGY REPORT*  Clinical Data: Altered mental status.  CT HEAD WITHOUT CONTRAST  Technique:  Contiguous axial images were obtained from the base of the skull through the vertex without contrast.  Comparison: None.  Findings: No intracranial hemorrhage.  Global atrophy without hydrocephalus.  The right middle cerebral artery and basilar artery appears slightly dense however, this may be normal rather than representing result of thrombus.  Overall, no CT findings of large acute thrombotic infarct.  CT imaging may not detect small acute infarct.  No intracranial mass lesion detected on this unenhanced exam.  IMPRESSION: Atrophy.  Please see above.  Original Report Authenticated By: Fuller Canada, M.D.     No diagnosis found.   Date: 09/22/2011  Rate: 122  Rhythm: sinus tachycardia  QRS Axis: normal  Intervals: normal  ST/T Wave abnormalities: normal  Conduction Disutrbances:none  Narrative Interpretation:   Old EKG Reviewed: none available    MDM  Patient with signs  concerning for possible infection given his abnormal vital signs and slight fever.  Patient has a normal chest x-ray and normal urinalysis.  Patient is been having problems with increased drooling from his baseline and difficulty with some of his secretions.  Patient has never been in any respiratory distress though.  Patient ultimately had a CT of his neck completed to further assess this since the soft tissue neck x-ray did not give a definitive answer but did not show epiglottitis.  The CT of his neck does demonstrate some increased tonsillar swelling and airway edema.  Given this I have written for steroids and racemic epi to try and decrease any swelling and edema.  Of concern this could be possibly infectious I will give the patient some Unasyn.  I'm going to discuss this patient with ENT physician for further recommendations and I do believe this patient warrants admission for further observation given concern for possible airway problems.  Patient at this time is still maintaining his airway well and controlling the majority of his secretions.  His heart rate is decreasing with fluid resuscitation as well.        Nat Christen, MD 09/22/11 2307  Patient discussed with Dr. Jenne Pane from ENT and he agrees with the current management of steroids, Unasyn and racemic epi.  He is requesting a internal medicine admission given the patient's significant other psychiatric and mental disabilities.  We agree that the patient likely needs to be placed in the stay medical step down bed for close observation given concern for his airway and the patient's inability to request help if he is beginning to have further trouble.  Nat Christen, MD 09/22/11 2314  CRITICAL CARE Performed by: Emeline General A   Total critical care time: 41 minutes  Critical care time was exclusive of separately billable procedures and treating other patients.  Critical care was necessary to treat or prevent imminent  or life-threatening deterioration.  Critical care was time spent personally by me on the following activities: development of treatment plan with patient and/or surrogate as well as nursing, discussions with consultants, evaluation of patient's response to treatment, examination of patient, obtaining history from patient or surrogate, ordering and performing treatments and interventions, ordering and review of laboratory studies, ordering and review of radiographic studies, pulse oximetry and re-evaluation of patient's condition.  Nat Christen, MD 09/22/11 2315  Discussed with [facility]ad hospitalist for admission to their service for further observation.  Nat Christen, MD 09/22/11 [phone]

## 2011-09-22 NOTE — ED Notes (Signed)
Soft limb holders applied to patient's wrist to prevent dislodgement of IV access.

## 2011-09-22 NOTE — ED Notes (Signed)
Family concerned about patient's drooling. Father states that patient normally drools but the amount of drooling that is occurring is excessive. Dr. Golda Acre notified.

## 2011-09-22 NOTE — ED Notes (Signed)
Admitting MD at bedside.

## 2011-09-22 NOTE — ED Notes (Signed)
Patient transported to X-ray 

## 2011-09-22 NOTE — H&P (Signed)
Primary Care Physician: Ermalinda Barrios MD (pediatrician)   Chief Complaint: New-onset lethargy and drooling  History of Present Illness: Patient is a 26 year old gentleman history of moderate retardation who presents for evaluation of new-onset lethargy and drooling. History is taken from the patient's mother, who lives at the patient. The patient was reportedly in his usual state of health until today when he acutely became less talkative and "being more needy than usual" throughout the day. Later, he was found to be slouched over in a chair, minimally responsive, and drooling. Per the report of the mother, he was reportedly had been seen to have had "his eyes roll back in his head", tremulous but with no tonic-clonic movement. No tongue biting or bowel/bladder incontinence. Has never had seizure in the past nor does the mother know what a seizure looks like. The family brought him in for further evaluation.  In regards to recent history, the mother reports that he is usually talkative and a very active person who enjoys going to school and interacting with people. As result, this change is very drastic for this patient. Denies any recent fevers or chills. No nausea or vomiting. No recent sick contacts or travel. Has been at home for the past 2 weeks because his father was in town. Never smoker, no illicits. Was unable to take his morning medications as directed for the past 2 days because he ran out of medications and difficulties were presented in obtaining more medications given Medicaid status.  In the emergency room, temperature 99.4, blood pressure 141/92, heart rate 114, respirations 16, satting 98% on room air. Valproic acid level slightly supratherapeutic. CT neck demonstrating narrowing of the airway with no abscess identified. The patient was given Decadron 10 mg IV x1, racemic epinephrine, and started on Unasyn.  ENT consulted, felt there were no acute surgical emergencies and that patient  should be admitted to the medical service for admission, to see patient in the morning.  Past Medical/Surgical History: Moderate mental retardation  Allergies: No Known Allergies  No current facility-administered medications on file prior to encounter.   Current Outpatient Prescriptions on File Prior to Encounter  Medication Sig Dispense Refill  . alprazolam (XANAX) 2 MG tablet Take 2 tablets by mouth every morning       . asenapine (SAPHRIS) 5 MG SUBL Place 5 mg under the tongue 2 (two) times daily.        Marland Kitchen desmopressin (DDAVP) 0.2 MG tablet Take 0.2 mg by mouth at bedtime.        Marland Kitchen Dextromethorphan-Guaifenesin (MUCUS RELIEF DM) 20-400 MG TABS Take 1 tablet by mouth every 4 (four) hours as needed.        . divalproex (DEPAKOTE ER) 500 MG 24 hr tablet 500 mg. Take 3 tablets by mouth at bedtime      . lisdexamfetamine (VYVANSE) 50 MG capsule Take 50 mg by mouth every morning.        . loratadine (CLARITIN) 10 MG tablet Take 10 mg by mouth daily.        . protriptyline (VIVACTIL) 5 MG tablet TAKE 1 TABLET AT BEDTIME FOR SNORING  15 tablet  5  . sertraline (ZOLOFT) 25 MG tablet Take 25 mg by mouth daily.          Family History: Strong family history of cardiac disease with unknown cancers on both sides of family  Social History: Single, lives with mother Reported no tobacco, EtOH or illicits  Review of Systems: (obtained from mother) General:  No fevers, chills, sweats, night sweats, weight loss Skin: No rashes or lacerations HEENT: No rhinorrhea, sore throat, dry mouth, hearing difficulties Pulmonary: No cough, wheezing, shortness of breath Cardivascular: No chest pain, dyspnea on exertion, palpitations, lightheaded/dizziness, paroxysmal nocturnal dyspnea, orthopnea Gastrointestinal: No abdominal pain, dysphagia, odynophagia, nausea, vomiting, hematemesis, melena, hematochezia, bowel changes Genitourinary: No dysuria, hematuria, increased urinary frequency/urgency. No  discharge Musculoskeletal: No muscle aches, pain. No arthritis Hematologic: No easy bruising or bleeding Neurologic: No headaches, vision changes, focal neurologic deficits Psychologic: No suicidial or homicidal ideation. No depression  Filed Vitals:   09/22/11 2217 09/22/11 2231 09/22/11 2300 09/22/11 2315  BP: 161/102 149/63 150/67 155/85  Pulse: 107 105 107 114  Temp:      TempSrc:      Resp: 18 20 17 19   Height:      Weight:      SpO2: 95% 100% 98% 98%    Physical Exam: General: Alert and oriented, follows simple commands no apparent distress Skin: No rashes, bruises HEENT: Head atraumatic, sclera anicertic, pupils equal and reactive to light, airway crowded (Malnampati IV) with posterior pharynx unable to be visualized. Neck: Soft, no lymphadenopathy, thyromegaly, or bruits Chest: Clear to auscultation bilaterally, no wheezes, rales, or ronchi Heart: Regular rate and rhythm, normal S1/S2 no rubs, gallops, or murmurs Abdomen: Soft, nontender, obese, + bowel sounds, no masses Extremities: No cyanosis, clubbing, or edema. 2+ radial and dorsalis pedis pulses bilaterally Neurologic: Grossly intact   Labs: CBC    Component Value Date/Time   WBC 5.9 09/22/2011 2000   RBC 4.72 09/22/2011 2000   HGB 14.4 09/22/2011 2000   HCT 42.6 09/22/2011 2000   PLT 293 09/22/2011 2000   MCV 90.3 09/22/2011 2000   MCH 30.5 09/22/2011 2000   MCHC 33.8 09/22/2011 2000   RDW 12.1 09/22/2011 2000   LYMPHSABS 1.6 09/22/2011 2000   MONOABS 0.4 09/22/2011 2000   EOSABS 0.0 09/22/2011 2000   BASOSABS 0.0 09/22/2011 2000    BMET    Component Value Date/Time   NA 135 09/22/2011 2000   K 4.1 09/22/2011 2000   CL 97 09/22/2011 2000   CO2 24 09/22/2011 2000   GLUCOSE 90 09/22/2011 2000   BUN 11 09/22/2011 2000   CREATININE 0.88 09/22/2011 2000   CALCIUM 9.9 09/22/2011 2000   GFRNONAA >90 09/22/2011 2000   GFRAA >90 09/22/2011 2000    Liver function tests: AST 27, ALT 30, alkaline phosphatase 56, total bilirubin 0.4, total  protein 6.2, albumin 4.3  Ammonia level 61  Valproic acid level 117  Procalcitonin < 0.10  Lactic acid 2.2  Mono screen negative  UA grossly negative  Utox: Positive for benzodiazepines and amphetamines  Head CT: No intracranial hemorrhage.  Global atrophy without hydrocephalus.  The right middle cerebral artery and basilar artery appears  slightly dense however, this may be normal rather than representing  result of thrombus.  Overall, no CT findings of large acute thrombotic infarct. CT  imaging may not detect small acute infarct.  No intracranial mass lesion detected on this unenhanced exam.   Neck CT: IMPRESSION:  1. Significant narrowing of the nasal pharyngeal and  hypopharyngeal airway associated with tonsillar hypertrophy. There  is probably mucosal edema extending to the level of the glottis.  No complete obstruction of the airway is identified.  2. Negative for soft tissue abscess.  3. Peri apical lucencies around the first mandibular molars  bilaterally suggesting chronic odontogenic infection. Consider  follow-up dental consultation.  4.  Based on the exuberant tonsillar hypertrophy, question  infectious mononucleosis.   Chest radiograph: IMPRESSION:  Elevated left hemidiaphragm limiting evaluation of the left base.   Impression/Plan: 26 year old gentleman history of moderate retardation who presents for evaluation of new-onset lethargy, mental status changes, and drooling, currently afebrile and hemodynamically stable with CT suggestive of airway narrowing and tonsillar enlargement, felt likely to be acute and infectious in nature.  Airway narrowing/tonsilar enlargement: - Admit to [facility] - Continue Decadron 10mg  IV daily - On Unasyn - Racemic epinephrine as needed - ENT to see patient in AM  Mental status changes/lethargy: Workup negative for any acute sepsis. Elevated ammonia level of unclear significance given lack of liver failure. Likely  related to above although mother's history also does raise concern for possible seizure, which may have been potentiated by the effects of not having some of his psychotropic medications for two days although Valproic acid level was within normal limits. - Continue as above - Follow-up blood and urine cultures - Will consider discussing with his PCP prior to any further evaluation  Fluid/electrolytes/nutrition: - Maintenance fluids - Monitor electrolytes daily - NPO for now given airway concern  Prophylaxis: - Lovenox  CODE STATUS: Full code

## 2011-09-22 NOTE — ED Notes (Signed)
Pt in c/o sudden onset of altered mental status, per family approx 30 min ago pt became nonverbal and noted increased drooling and pt tongue was hanging out of mouth, pt is normally verbal and able to walk, pt unable to ambulate or stand, no control over left leg, pt with eyes open but does not answer any questions, per family decreased PO intake today and pt did not have normal daily medications, pt hot to touch

## 2011-09-22 NOTE — ED Notes (Signed)
Mother states that around 7pm patient had change in mental status. Patient unable to stand, walk, not talking. Patient normally able to walk unassisted, talkative and laughing.

## 2011-09-22 NOTE — ED Notes (Addendum)
Patient does not follow commands at this time. Patient tracks light when pupils assessed. Patient has a cough and gag reflex. Patient does not squeeze with hands when instructed to do so. Family states that patient normally follows commands. Patient does not lift legs when asked to do so. Unable to assess for pronator drift.  Mother states that patient has been without his Xanax and Vynase for the past 2 days. CBG 91 upon arrival to ER.

## 2011-09-22 NOTE — ED Notes (Signed)
RT notified of need for Racepinephrine neb.

## 2011-09-22 NOTE — ED Notes (Signed)
Returned from CT.

## 2011-09-22 NOTE — ED Notes (Signed)
PA Lawyer notified of pt status

## 2011-09-23 ENCOUNTER — Encounter (HOSPITAL_COMMUNITY): Payer: Self-pay | Admitting: *Deleted

## 2011-09-23 ENCOUNTER — Emergency Department (HOSPITAL_COMMUNITY)
Admit: 2011-09-23 | Discharge: 2011-09-23 | Disposition: A | Discharge status: Home / Self Care | Payer: Medicaid Other | Attending: Internal Medicine | Admitting: Internal Medicine

## 2011-09-23 LAB — CBC
MCV: 91 fL (ref 78.0–100.0)
Platelets: 308 10*3/uL (ref 150–400)
RDW: 12.4 % (ref 11.5–15.5)
WBC: 9.1 10*3/uL (ref 4.0–10.5)

## 2011-09-23 LAB — BASIC METABOLIC PANEL
CO2: 24 mEq/L (ref 19–32)
Calcium: 9.7 mg/dL (ref 8.4–10.5)
Creatinine, Ser: 0.89 mg/dL (ref 0.50–1.35)
GFR calc Af Amer: >90 mL/min (ref >90)

## 2011-09-23 MED ORDER — ONDANSETRON HCL 4 MG/2ML IJ SOLN: 4.0000 mg | Freq: Four times a day (QID) | INTRAMUSCULAR | Status: DC | PRN

## 2011-09-23 MED ORDER — INFLUENZA VIRUS VACC SPLIT PF IM SUSP
0.5000 mL | INTRAMUSCULAR | Status: AC
  Administered 2011-09-24: 0.5 mL via INTRAMUSCULAR
  Filled 2011-09-23: qty 0.5

## 2011-09-23 MED ORDER — ONDANSETRON HCL 4 MG PO TABS: 4.0000 mg | ORAL_TABLET | Freq: Four times a day (QID) | ORAL | Status: DC | PRN

## 2011-09-23 MED ORDER — ALPRAZOLAM 1 MG PO TABS
4.0000 mg | ORAL_TABLET | Freq: Every day | ORAL | Status: DC
  Administered 2011-09-24 – 2011-09-25 (×2): 4 mg via ORAL
  Filled 2011-09-23 (×2): qty 4

## 2011-09-23 MED ORDER — ACETAMINOPHEN 650 MG RE SUPP: 650.0000 mg | Freq: Four times a day (QID) | RECTAL | Status: DC | PRN

## 2011-09-23 MED ORDER — ACETAMINOPHEN 325 MG PO TABS: 650.0000 mg | ORAL_TABLET | Freq: Four times a day (QID) | ORAL | Status: DC | PRN

## 2011-09-23 MED ORDER — LIDOCAINE HCL 2 % IJ SOLN
INTRAMUSCULAR | Status: AC
  Administered 2011-09-23: 05:00:00
  Filled 2011-09-23: qty 1

## 2011-09-23 MED ORDER — DEXTROSE-NACL 5-0.45 % IV SOLN
INTRAVENOUS | Status: DC
  Administered 2011-09-23 (×2): via INTRAVENOUS

## 2011-09-23 MED ORDER — SODIUM CHLORIDE 0.9 % IV SOLN
3.0000 g | Freq: Four times a day (QID) | INTRAVENOUS | Status: DC
  Administered 2011-09-23 – 2011-09-24 (×2): 3 g via INTRAVENOUS
  Filled 2011-09-23 (×8): qty 3

## 2011-09-23 MED ORDER — DEXAMETHASONE SODIUM PHOSPHATE 10 MG/ML IJ SOLN
10.0000 mg | INTRAMUSCULAR | Status: DC
  Administered 2011-09-23: 10 mg via INTRAVENOUS
  Filled 2011-09-23 (×3): qty 1

## 2011-09-23 MED ORDER — DESMOPRESSIN ACETATE 0.2 MG PO TABS
0.2000 mg | ORAL_TABLET | Freq: Every day | ORAL | Status: DC
  Administered 2011-09-23 – 2011-09-24 (×2): 0.2 mg via ORAL
  Filled 2011-09-23 (×3): qty 1

## 2011-09-23 MED ORDER — LISDEXAMFETAMINE DIMESYLATE 50 MG PO CAPS
50.0000 mg | ORAL_CAPSULE | Freq: Every day | ORAL | Status: DC
  Administered 2011-09-24 – 2011-09-25 (×2): 50 mg via ORAL
  Filled 2011-09-23 (×2): qty 1

## 2011-09-23 MED ORDER — ENOXAPARIN SODIUM 40 MG/0.4ML ~~LOC~~ SOLN
40.0000 mg | SUBCUTANEOUS | Status: DC
Start: 2011-09-23 — End: 2011-09-23

## 2011-09-23 MED ORDER — ASENAPINE MALEATE 5 MG SL SUBL
5.0000 mg | SUBLINGUAL_TABLET | Freq: Two times a day (BID) | SUBLINGUAL | Status: DC
  Administered 2011-09-23 – 2011-09-25 (×4): 5 mg via SUBLINGUAL
  Filled 2011-09-23 (×5): qty 1

## 2011-09-23 MED ORDER — SERTRALINE HCL 25 MG PO TABS
25.0000 mg | ORAL_TABLET | Freq: Every day | ORAL | Status: DC
  Administered 2011-09-24 – 2011-09-25 (×2): 25 mg via ORAL
  Filled 2011-09-23 (×2): qty 1

## 2011-09-23 MED ORDER — ENOXAPARIN SODIUM 80 MG/0.8ML ~~LOC~~ SOLN
70.0000 mg | SUBCUTANEOUS | Status: DC
  Administered 2011-09-23 – 2011-09-25 (×3): 70 mg via SUBCUTANEOUS
  Filled 2011-09-23 (×3): qty 0.8

## 2011-09-23 MED ORDER — OXYMETAZOLINE HCL 0.05 % NA SOLN
1.0000 | Freq: Once | NASAL | Status: AC
  Administered 2011-09-23: 1 via NASAL
  Filled 2011-09-23: qty 15

## 2011-09-23 MED ORDER — RACEPINEPHRINE HCL 2.25 % IN NEBU
0.5000 mL | INHALATION_SOLUTION | RESPIRATORY_TRACT | Status: DC | PRN
  Filled 2011-09-23: qty 0.5

## 2011-09-23 NOTE — Progress Notes (Signed)
ED CM received a return call from [facility] at [place]. [facility] records indicates coverage no longer effective through [employer] and supplement insurance. Pending fax information

## 2011-09-23 NOTE — Progress Notes (Signed)
Speech Language/Pathology  1645 MD:  Order for SLP eval and tx received, could you please clarify if desire Speech/language/cognition evaluation/tx or swallow evaluation.  Note pt seen by ENT for acute laryngitis and SLP reviewed results of testing.    Thanks.  Dollene Cleveland, MS [facility] SLP  [phone]

## 2011-09-23 NOTE — Progress Notes (Signed)
ED CM spoke with Chad Middleton at [facility] [address], [place] to verify concerns with pt medication administration.  Chad Middleton states pt's medicaid is not allowing medications to be billed because it refers pharmacist to other coverage.  Medicaid would need to be called to correct issue.  Attempt to contact pt's mother in [place] rm # [phone] x 2 but unsuccessful

## 2011-09-23 NOTE — Progress Notes (Signed)
ED CM spoke with parents to informed them [facility] through [facility]. Father recalls a letter that was given to [facility] from [place] stating BCBS coverage last effective in 2011. B [place] at DSS. Pt is receives Medicaid through SSI (Social security) since 02/2008 therefore is not assigned a DSS case worker in [employer].  Spoke to [place] at [place] [phone]   & transferred to Voice mail box of Lavada Mesi, RN [phone]) requesting a copy of BCBS letter to fax to [facility]. Sp[facility] to [facility] to get fax number [phone]. Left E [employer] fax number.

## 2011-09-23 NOTE — Progress Notes (Signed)
ANTIBIOTIC CONSULT NOTE - INITIAL  Pharmacy Consult for Unasyn Indication: Acute laryngitis   No Known Allergies  Patient Measurements: Height: 6\' 2"  (188 cm) Weight: 318 lb (144.244 kg) IBW/kg (Calculated) : 82.2   Vital Signs: Temp: 98.3 F (36.8 C) (01/09 1704) Temp src: Oral (01/09 1704) BP: 152/85 mmHg (01/09 1704) Pulse Rate: 110  (01/09 1704)  Labs:  Basename 09/23/11 0500 09/22/11 2000  WBC 9.1 5.9  HGB 15.3 14.4  PLT 308 293  LABCREA -- --  CREATININE 0.89 0.88   Estimated Creatinine Clearance: 195.4 ml/min (by C-G formula based on Cr of 0.89).  Microbiology: No results found for this or any previous visit (from the past 720 hour(s)).  Medical History: Past Medical History  Diagnosis Date  . Mental retardation   . OSA (obstructive sleep apnea)   . ADHD (attention deficit hyperactivity disorder)   . Anxiety   . Incontinent of urine     Medications:  Scheduled:    . sodium chloride   Intravenous Once  . alprazolam  4 mg Oral QAC breakfast  . ampicillin-sulbactam (UNASYN) IV  3 g Intravenous Once  . asenapine  5 mg Sublingual BID  . desmopressin  0.2 mg Oral QHS  . dexamethasone  10 mg Intravenous Once  . dexamethasone  10 mg Intravenous Q24H  . enoxaparin (LOVENOX) injection  70 mg Subcutaneous Q24H  . influenza  inactive virus vaccine  0.5 mL Intramuscular Tomorrow-1000  . lidocaine      . lisdexamfetamine  50 mg Oral Daily  . oxymetazoline  1 spray Each Nare Once  . Racepinephrine HCl  0.5 mL Nebulization Once  . sertraline  25 mg Oral Daily  . sodium chloride  1,000 mL Intravenous Once  . DISCONTD: enoxaparin  40 mg Subcutaneous Q24H   Infusions:    . dextrose 5 % and 0.45% NaCl 100 mL/hr at 09/23/11 0403   Assessment: 25YOM with acute laryngitis to restart Unasyn following 1x dose last night.  Patient is afebrile without leukocytosis. SCr 0.89 (stable), CrCl>100 ml/min in young, obese patient.  Blood and Urine cultures sent.  Plan:    Unasyn 3g IV q6h.  Clance Boll 09/23/2011,6:07 PM

## 2011-09-23 NOTE — ED Notes (Signed)
Family at bedside. 

## 2011-09-23 NOTE — Progress Notes (Signed)
ED CM updated parents and sister in Rm 23. Pt more alert. Eyes open. Encouraged to call Social security office to discuss medicaid medication concerns provided with Social security office contact number and address.  Informed message left for [facility]. Voiced understanding and appreciation of services.

## 2011-09-23 NOTE — ED Notes (Signed)
Patient is resting comfortably. 

## 2011-09-23 NOTE — ED Notes (Signed)
Jenne Pane, MD at bedside.

## 2011-09-23 NOTE — Progress Notes (Deleted)
*  PRELIMINARY RESULTS* EEG has been performed.  Chad Middleton 09/23/2011, 1445 pm

## 2011-09-23 NOTE — Consult Note (Signed)
Reason for Consult:increased drooling, airway concern Referring Physician: emergency department  Chad Middleton is an 26 y.o. male.  HPI: According to the mother, 26 year old male with moderate MR and OSA who does not express symptoms well started acting not like himself yesterday with general weakness and increased drooling.  Additional details such as severity, aggravating measures, relieving measures difficult to determine.  A CT scan of the neck was performed and appears abnormal.  Patient being admitted to hospitalist service for observation and antibiotic therapy.  Past Medical History  Diagnosis Date  . Mental retardation   . OSA (obstructive sleep apnea)   . ADHD (attention deficit hyperactivity disorder)   . Anxiety     History reviewed. No pertinent past surgical history.  History reviewed. No pertinent family history.  Social History:  reports that he has never smoked. He does not have any smokeless tobacco history on file. He reports that he does not drink alcohol or use illicit drugs.  Allergies: No Known Allergies  Medications: I have reviewed the patient's current medications.  Results for orders placed during the hospital encounter of 09/22/11 (from the past 48 hour(s))  GLUCOSE, CAPILLARY     Status: Normal   Collection Time   09/22/11  7:49 PM      Component Value Range Comment   Glucose-Capillary 91  70 - 99 (mg/dL)   CBC     Status: Normal   Collection Time   09/22/11  8:00 PM      Component Value Range Comment   WBC 5.9  4.0 - 10.5 (K/uL)    RBC 4.72  4.22 - 5.81 (MIL/uL)    Hemoglobin 14.4  13.0 - 17.0 (g/dL)    HCT 16.1  09.6 - 04.5 (%)    MCV 90.3  78.0 - 100.0 (fL)    MCH 30.5  26.0 - 34.0 (pg)    MCHC 33.8  30.0 - 36.0 (g/dL)    RDW 40.9  81.1 - 91.4 (%)    Platelets 293  150 - 400 (K/uL)   DIFFERENTIAL     Status: Normal   Collection Time   09/22/11  8:00 PM      Component Value Range Comment   Neutrophils Relative 65  43 - 77 (%)    Neutro Abs 3.8   1.7 - 7.7 (K/uL)    Lymphocytes Relative 27  12 - 46 (%)    Lymphs Abs 1.6  0.7 - 4.0 (K/uL)    Monocytes Relative 7  3 - 12 (%)    Monocytes Absolute 0.4  0.1 - 1.0 (K/uL)    Eosinophils Relative 1  0 - 5 (%)    Eosinophils Absolute 0.0  0.0 - 0.7 (K/uL)    Basophils Relative 0  0 - 1 (%)    Basophils Absolute 0.0  0.0 - 0.1 (K/uL)   VALPROIC ACID LEVEL     Status: Abnormal   Collection Time   09/22/11  8:00 PM      Component Value Range Comment   Valproic Acid Lvl 117.2 (*) 50.0 - 100.0 (ug/mL)   PROCALCITONIN     Status: Normal   Collection Time   09/22/11  8:00 PM      Component Value Range Comment   Procalcitonin <0.10     COMPREHENSIVE METABOLIC PANEL     Status: Normal   Collection Time   09/22/11  8:00 PM      Component Value Range Comment   Sodium 135  135 - 145 (mEq/L)    Potassium 4.1  3.5 - 5.1 (mEq/L) NO VISIBLE HEMOLYSIS   Chloride 97  96 - 112 (mEq/L)    CO2 24  19 - 32 (mEq/L)    Glucose, Bld 90  70 - 99 (mg/dL)    BUN 11  6 - 23 (mg/dL)    Creatinine, Ser 9.60  0.50 - 1.35 (mg/dL)    Calcium 9.9  8.4 - 10.5 (mg/dL)    Total Protein 8.2  6.0 - 8.3 (g/dL)    Albumin 4.3  3.5 - 5.2 (g/dL)    AST 27  0 - 37 (U/L) NO VISIBLE HEMOLYSIS   ALT 30  0 - 53 (U/L)    Alkaline Phosphatase 56  39 - 117 (U/L)    Total Bilirubin 0.4  0.3 - 1.2 (mg/dL)    GFR calc non Af Amer >90  >90 (mL/min)    GFR calc Af Amer >90  >90 (mL/min)   ETHANOL     Status: Normal   Collection Time   09/22/11  8:00 PM      Component Value Range Comment   Alcohol, Ethyl (B) <11  0 - 11 (mg/dL)   MONONUCLEOSIS SCREEN     Status: Normal   Collection Time   09/22/11  8:00 PM      Component Value Range Comment   Mono Screen NEGATIVE  NEGATIVE    LACTIC ACID, PLASMA     Status: Normal   Collection Time   09/22/11  8:45 PM      Component Value Range Comment   Lactic Acid, Venous 2.2  0.5 - 2.2 (mmol/L)   AMMONIA     Status: Abnormal   Collection Time   09/22/11  8:45 PM      Component Value Range  Comment   Ammonia 61 (*) 11 - 60 (umol/L)   URINALYSIS, ROUTINE W REFLEX MICROSCOPIC     Status: Abnormal   Collection Time   09/22/11  9:09 PM      Component Value Range Comment   Color, Urine AMBER (*) YELLOW  BIOCHEMICALS MAY BE AFFECTED BY COLOR   APPearance CLEAR  CLEAR     Specific Gravity, Urine 1.034 (*) 1.005 - 1.030     pH 6.0  5.0 - 8.0     Glucose, UA NEGATIVE  NEGATIVE (mg/dL)    Hgb urine dipstick NEGATIVE  NEGATIVE     Bilirubin Urine SMALL (*) NEGATIVE     Ketones, ur 15 (*) NEGATIVE (mg/dL)    Protein, ur 30 (*) NEGATIVE (mg/dL)    Urobilinogen, UA 1.0  0.0 - 1.0 (mg/dL)    Nitrite NEGATIVE  NEGATIVE     Leukocytes, UA NEGATIVE  NEGATIVE    URINE RAPID DRUG SCREEN (HOSP PERFORMED)     Status: Abnormal   Collection Time   09/22/11  9:09 PM      Component Value Range Comment   Opiates NONE DETECTED  NONE DETECTED     Cocaine NONE DETECTED  NONE DETECTED     Benzodiazepines POSITIVE (*) NONE DETECTED     Amphetamines POSITIVE (*) NONE DETECTED     Tetrahydrocannabinol NONE DETECTED  NONE DETECTED     Barbiturates NONE DETECTED  NONE DETECTED    URINE MICROSCOPIC-ADD ON     Status: Normal   Collection Time   09/22/11  9:09 PM      Component Value Range Comment   Squamous Epithelial / LPF RARE  RARE  WBC, UA 0-2  <3 (WBC/hpf)    RBC / HPF 0-2  <3 (RBC/hpf)    Bacteria, UA RARE  RARE     Urine-Other MUCOUS PRESENT       Dg Neck Soft Tissue  09/22/2011  *RADIOLOGY REPORT*  Clinical Data: Altered mental status.  Wheezing.  NECK SOFT TISSUES - 1+ VIEW  Comparison: None.  Findings: The epiglottis is not well delineated on the present exam.  The hypopharynx does not appear dilated.  Prominence of soft tissue in the upper posterior-superior nasopharynx may represent adenoidal tissue. If further delineation is clinically desired, CT neck can be performed.  Question of small laryngocele.  IMPRESSION: The epiglottis is not well delineated on the present exam.  The hypopharynx  does not appear dilated.  Prominence of soft tissue in the upper posterior-superior nasopharynx may represent adenoidal tissue.  Question of small laryngocele.  Original Report Authenticated By: Fuller Canada, M.D.   Dg Chest 1 View  09/22/2011  *RADIOLOGY REPORT*  Clinical Data: Altered mental status.  Tachycardia.  CHEST - 1 VIEW  Comparison: None.  Findings: Poor inspiration with elevated left hemidiaphragm limiting evaluation of the left base.  Heart size within normal limits.  No pneumothorax or pulmonary edema.  IMPRESSION: Elevated left hemidiaphragm limiting evaluation of the left base.  Original Report Authenticated By: Fuller Canada, M.D.   Ct Head Wo Contrast  09/22/2011  *RADIOLOGY REPORT*  Clinical Data: Altered mental status.  CT HEAD WITHOUT CONTRAST  Technique:  Contiguous axial images were obtained from the base of the skull through the vertex without contrast.  Comparison: None.  Findings: No intracranial hemorrhage.  Global atrophy without hydrocephalus.  The right middle cerebral artery and basilar artery appears slightly dense however, this may be normal rather than representing result of thrombus.  Overall, no CT findings of large acute thrombotic infarct.  CT imaging may not detect small acute infarct.  No intracranial mass lesion detected on this unenhanced exam.  IMPRESSION: Atrophy.  Please see above.  Original Report Authenticated By: Fuller Canada, M.D.   Ct Soft Tissue Neck W Contrast  09/22/2011  *RADIOLOGY REPORT*  Clinical Data: Improving.  Possible infection.  CT NECK WITH CONTRAST  Technique:  Multidetector CT imaging of the neck was performed with intravenous contrast.  Contrast: OMNIPAQUE IOHEXOL 300 MG/ML IV SOLN  Comparison: 09/22/2011.  Findings: Grossly, visualized intracranial contents appear normal. Globes and orbits grossly appear normal.  Peri apical lucencies are present around the roots of the first molar roots bilaterally in the mandibles, suggesting  odontogenic infection.  There is no abscess identified.  Diffuse adenoidal and tonsillar hypertrophy is present.  There is no abscess identified.  Soft tissue swelling is present along the trachea with narrowing of the posterior nasopharynx due to adenoidal hypertrophy and hypertrophy of the soft palate.  The upper airway narrowing is also present associated with tonsillar hypertrophy.  This extends to the glottis.  Visualized lung apices appear normal.  Infraglottic trachea appears normal.  Parotid glands grossly appear normal.  IMPRESSION: 1.  Significant narrowing of the nasal pharyngeal and hypopharyngeal airway associated with tonsillar hypertrophy.  There is probably mucosal edema extending to the level of the glottis. No complete obstruction of the airway is identified. 2.  Negative for soft tissue abscess. 3.  Peri apical lucencies around the first mandibular molars bilaterally suggesting chronic odontogenic infection.  Consider follow-up dental consultation. 4.  Based on the exuberant tonsillar hypertrophy, question infectious mononucleosis.  Original Report Authenticated By: Andreas Newport, M.D.    Review of Systems  Unable to perform ROS: other   Blood pressure 150/84, pulse 111, temperature 99.4 F (37.4 C), temperature source Rectal, resp. rate 16, height 6\' 2"  (1.88 m), weight 144.244 kg (318 lb), SpO2 97.00%. Physical Exam  Constitutional: He appears well-developed and well-nourished. No distress.  HENT:  Head: Normocephalic and atraumatic.  Right Ear: Right ear decreased hearing: cerumen impaction, unable to visualize TM.  Left Ear: Left ear decreased hearing: cerumen impaction, unable to visualize TM.  Nose: Nose normal.  Mouth/Throat: Oropharynx is clear and moist. No oropharyngeal exudate.  Eyes: EOM are normal. Pupils are equal, round, and reactive to light.  Neck: Normal range of motion. Neck supple.  Cardiovascular: Normal rate and regular rhythm.   Respiratory: Effort  normal. No stridor.  GI:       Did not examine.  Genitourinary:       Did not examine.  Musculoskeletal: Normal range of motion.  Lymphadenopathy:    He has no cervical adenopathy.  Neurological: He is alert.  Skin: Skin is warm and dry.  Psychiatric:       Cannot assess.    Assessment/Plan: Drooling, narrowed pharyngeal airway on CT. I personally reviewed the neck CT scan and there is narrowing of the pharyngeal airway of unclear acuity.  Patient is comfortable-appearing with no stridor, distress, or nuchal rigidity.  I performed a fiberoptic exam of the pharynx and larynx.  Laryngeal structures are normal with no epiglottic swelling.  The pharyngeal walls narrow the airway from laterally on both sides similarly to what is seen in significant obstructive sleep apnea but there is no inflammatory signs.  It seems that the CT findings are likely his normal, baseline anatomy and do not represent acute infection.  However, admission for observation and antibiotic therapy still seems justified.  He can be admitted to a regular room and discharged when he improves toward his baseline regarding drooling and weakness.  Samadhi Mahurin D 09/23/2011, 5:01 AM

## 2011-09-23 NOTE — ED Notes (Signed)
Patient denies pain and is resting comfortably.  

## 2011-09-23 NOTE — Progress Notes (Signed)
ED CM noted Admission RN Cm consult for medication assistance needs.  CM spoke with mother of pt who states Pt has not had home medications in 3 days because [facility] on [address] would not fill them because they state pt has another insurance other than medicaid.  The father previously had pt on his [employer] coverage but mother states he removed pt from [facility] plan for 2013.

## 2011-09-23 NOTE — Progress Notes (Addendum)
PATIENT DETAILS Name: Chad Middleton Age: 26 y.o. Sex: male Date of Birth: [birth year] Admit Date: 09/22/2011 ZOX:[id number],JXBJ M, MD, MD  Subjective: Currently nonverbal, patient's mother at bedside, she claims patient much better but not yet back to hIS usual baseline. The patient's mother-patient spoke earlier this morning. Patient is moving all 4 extremities and following some commands. At baseline he chronically has drooling, the mother this is almost back to baseline. During my rounds patient attempted to pull his IV line out.  Objective: Vital signs in last 24 hours: Filed Vitals:   09/23/11 0343 09/23/11 0759 09/23/11 0917 09/23/11 1214  BP: 150/84 145/75  139/85  Pulse: 111   114  Temp:    98.7 F (37.1 C)  TempSrc:    Oral  Resp: 16 16  18   Height:      Weight:      SpO2: 97% 96% 100% 98%    Weight change:   Body mass index is 40.83 kg/(m^2).  Intake/Output from previous day: No intake or output data in the 24 hours ending 09/23/11 1436  PHYSICAL EXAM: Gen Exam: Awake follow some commands. Per mother at bedside he spoke early this morning. Currently not speaking to me. He does not look to be in any distress. Neck: Supple Chest: B/L Clear-anteriorly to CVS: S1 S2 Regular, no murmurs. Slightly tachycardic. Abdomen: soft, BS +, non tender, non distended. Extremities: no edema, lower extremities warm to touch. Neurologic: Difficult to evaluate, but moves legs on commands. Moving hands as well. Appears nonfocal. Skin: No Rash.   Wounds: N/A.    CONSULTS:  ent  LAB RESULTS: CBC  Lab 09/23/11 0500 09/22/11 2000  WBC 9.1 5.9  HGB 15.3 14.4  HCT 44.3 42.6  PLT 308 293  MCV 91.0 90.3  MCH 31.4 30.5  MCHC 34.5 33.8  RDW 12.4 12.1  LYMPHSABS -- 1.6  MONOABS -- 0.4  EOSABS -- 0.0  BASOSABS -- 0.0  BANDABS -- --    Chemistries   Lab 09/23/11 0500 09/22/11 2000  NA 133* 135  K 4.2 4.1  CL 98 97  CO2 24 24  GLUCOSE 138* 90  BUN 10 11  CREATININE 0.89  0.88  CALCIUM 9.7 9.9  MG -- --    GFR Estimated Creatinine Clearance: 195.4 ml/min (by C-G formula based on Cr of 0.89).  Coagulation profile No results found for this basename: INR:5,PROTIME:5 in the last 168 hours  Cardiac Enzymes No results found for this basename: CK:3,CKMB:3,TROPONINI:3,MYOGLOBIN:3 in the last 168 hours  No components found with this basename: POCBNP:3 No results found for this basename: DDIMER:2 in the last 72 hours No results found for this basename: HGBA1C:2 in the last 72 hours No results found for this basename: CHOL:2,HDL:2,LDLCALC:2,TRIG:2,CHOLHDL:2,LDLDIRECT:2 in the last 72 hours No results found for this basename: TSH,T4TOTAL,FREET3,T3FREE,THYROIDAB in the last 72 hours No results found for this basename: VITAMINB12:2,FOLATE:2,FERRITIN:2,TIBC:2,IRON:2,RETICCTPCT:2 in the last 72 hours No results found for this basename: LIPASE:2,AMYLASE:2 in the last 72 hours  Urine Studies No results found for this basename: UACOL:2,UAPR:2,USPG:2,UPH:2,UTP:2,UGL:2,UKET:2,UBIL:2,UHGB:2,UNIT:2,UROB:2,ULEU:2,UEPI:2,UWBC:2,URBC:2,UBAC:2,CAST:2,CRYS:2,UCOM:2,BILUA:2 in the last 72 hours  MICROBIOLOGY: No results found for this or any previous visit (from the past 240 hour(s)).  RADIOLOGY STUDIES/RESULTS: Dg Neck Soft Tissue  09/22/2011  *RADIOLOGY REPORT*  Clinical Data: Altered mental status.  Wheezing.  NECK SOFT TISSUES - 1+ VIEW  Comparison: None.  Findings: The epiglottis is not well delineated on the present exam.  The hypopharynx does not appear dilated.  Prominence of soft tissue in the upper  posterior-superior nasopharynx may represent adenoidal tissue. If further delineation is clinically desired, CT neck can be performed.  Question of small laryngocele.  IMPRESSION: The epiglottis is not well delineated on the present exam.  The hypopharynx does not appear dilated.  Prominence of soft tissue in the upper posterior-superior nasopharynx may represent adenoidal  tissue.  Question of small laryngocele.  Original Report Authenticated By: Fuller Canada, M.D.   Dg Chest 1 View  09/22/2011  *RADIOLOGY REPORT*  Clinical Data: Altered mental status.  Tachycardia.  CHEST - 1 VIEW  Comparison: None.  Findings: Poor inspiration with elevated left hemidiaphragm limiting evaluation of the left base.  Heart size within normal limits.  No pneumothorax or pulmonary edema.  IMPRESSION: Elevated left hemidiaphragm limiting evaluation of the left base.  Original Report Authenticated By: Fuller Canada, M.D.   Ct Head Wo Contrast  09/22/2011  *RADIOLOGY REPORT*  Clinical Data: Altered mental status.  CT HEAD WITHOUT CONTRAST  Technique:  Contiguous axial images were obtained from the base of the skull through the vertex without contrast.  Comparison: None.  Findings: No intracranial hemorrhage.  Global atrophy without hydrocephalus.  The right middle cerebral artery and basilar artery appears slightly dense however, this may be normal rather than representing result of thrombus.  Overall, no CT findings of large acute thrombotic infarct.  CT imaging may not detect small acute infarct.  No intracranial mass lesion detected on this unenhanced exam.  IMPRESSION: Atrophy.  Please see above.  Original Report Authenticated By: Fuller Canada, M.D.   Ct Soft Tissue Neck W Contrast  09/22/2011  *RADIOLOGY REPORT*  Clinical Data: Improving.  Possible infection.  CT NECK WITH CONTRAST  Technique:  Multidetector CT imaging of the neck was performed with intravenous contrast.  Contrast: OMNIPAQUE IOHEXOL 300 MG/ML IV SOLN  Comparison: 09/22/2011.  Findings: Grossly, visualized intracranial contents appear normal. Globes and orbits grossly appear normal.  Peri apical lucencies are present around the roots of the first molar roots bilaterally in the mandibles, suggesting odontogenic infection.  There is no abscess identified.  Diffuse adenoidal and tonsillar hypertrophy is present.  There  is no abscess identified.  Soft tissue swelling is present along the trachea with narrowing of the posterior nasopharynx due to adenoidal hypertrophy and hypertrophy of the soft palate.  The upper airway narrowing is also present associated with tonsillar hypertrophy.  This extends to the glottis.  Visualized lung apices appear normal.  Infraglottic trachea appears normal.  Parotid glands grossly appear normal.  IMPRESSION: 1.  Significant narrowing of the nasal pharyngeal and hypopharyngeal airway associated with tonsillar hypertrophy.  There is probably mucosal edema extending to the level of the glottis. No complete obstruction of the airway is identified. 2.  Negative for soft tissue abscess. 3.  Peri apical lucencies around the first mandibular molars bilaterally suggesting chronic odontogenic infection.  Consider follow-up dental consultation. 4.  Based on the exuberant tonsillar hypertrophy, question infectious mononucleosis.  Original Report Authenticated By: Andreas Newport, M.D.    MEDICATIONS: Scheduled Meds:   . sodium chloride   Intravenous Once  . ampicillin-sulbactam (UNASYN) IV  3 g Intravenous Once  . dexamethasone  10 mg Intravenous Once  . enoxaparin (LOVENOX) injection  70 mg Subcutaneous Q24H  . influenza  inactive virus vaccine  0.5 mL Intramuscular Tomorrow-1000  . lidocaine      . oxymetazoline  1 spray Each Nare Once  . Racepinephrine HCl  0.5 mL Nebulization Once  . sodium chloride  1,000 mL Intravenous Once  . DISCONTD: enoxaparin  40 mg Subcutaneous Q24H   Continuous Infusions:   . dextrose 5 % and 0.45% NaCl 100 mL/hr at 09/23/11 0403   PRN Meds:.acetaminophen, acetaminophen, iohexol, ondansetron (ZOFRAN) IV, ondansetron, Racepinephrine HCl  Antibiotics: Anti-infectives     Start     Dose/Rate Route Frequency Ordered Stop   09/22/11 2300   Ampicillin-Sulbactam (UNASYN) 3 g in sodium chloride 0.9 % 100 mL IVPB        3 g 100 mL/hr over 60 Minutes Intravenous   Once 09/22/11 2251 09/23/11 0009          Assessment/Plan: Patient Active Hospital Problem List: Acute laryngitis    Assessment: ENT evaluation completed, direct laryngoscopy did not show any definite pathology.    Plan: Will continue with current antibiotics. Continue to monitor clinical course. Patient is afebrile and does not have leukocytosis as well. His neck is supple.  Excessive drooling -The patient's mother claims that patient always does at baseline, yesterday it was much more than usual. Today it seems to be very close to usual baseline.  ? Seizure episode -History obtained today, apparently patient's sister found the patient lethargic with excessive drooling that improved within one hour also. There was no obvious tonic-clonic movements noticed. He chronically has urinary incontinence .CT of the head is negative, patient does not have a prior history of seizures in the past. As noted above he does not appear toxic, he does not have a fever and his neck is supple. At this time we will continue to monitor him however I will obtain EEG. If he were to have a recurrence of the above-noted symptoms, and perhaps we will need further imaging of his brain. I have consulted neurology, to see if we can optimize some of his medications. We may need psychiatric evaluation at some point as well.  Elevated Depakote levels -Will hold Depakote, recheck levels in the morning.   Mental retardation: -We'll resume home medications.  Disposition: Remain inpatient  DVT Prophylaxis: Lovenox.  Code Status: Full code.  Maretta Bees, MD. 09/23/2011, 2:36 PM

## 2011-09-23 NOTE — Procedures (Signed)
REFERRING PHYSICIAN:  Jeoffrey Massed, MD  HISTORY:  A 26 year old male with altered mental status.  MEDICATIONS:  Decadron, Lovenox, aspirin, Saphris, DDAVP, Depakote, Vyvanse and Zoloft.  CONDITIONS OF RECORDING:  This is a 16-channel EEG carried out with the patient in the unresponsive state.  DESCRIPTION:  Muscle and movement artifact is prominent during the tracing.  The background activity is dominated by a very low voltage, fast beta activity that is seen over both hemispheres in all quadrants. Due to the muscle and movement artifact.  It was difficult to tell whether there was any reactivity during the tracing.  This low-voltage beta activity was persistent and continuous throughout the tracing. Hypoventilation and intermittent photic stimulation were not performed.  IMPRESSION:  This is a technically difficult record secondary to the predominance of muscle and movement artifact.  No epileptiform activity was noted.          ______________________________ Thana Farr, MD    ZO:[id number] D:  09/23/2011 23:29:14  T:  09/23/2011 23:42:24  Job #:  [id number]

## 2011-09-23 NOTE — ED Notes (Signed)
Vital signs stable. 

## 2011-09-23 NOTE — Procedures (Signed)
Preop diagnosis: Drooling, pharyngeal narrowing Postop diagnosis: same Procedure: Transnasal fiberoptic laryngoscopy Surgeon: Christia Reading, MD Anesthesia: Topical with Afrin and 2% lidocaine Complications: None Indication: 26 year old male with moderate MR came to ER with increased drooling and generalized weakness.  CT showed pharyngeal narrowing of unclear acuity. Findings: Pharynx is narrowed from laterally on each side making the pharynx shape more like a round tube.  There are not inflammatory signs, however, and anatomy is consistent with marked sleep apnea.  The larynx is normal with no epiglottic edema and normally mobile vocal folds. Description of procedure: The patient was identified in the ER and informed consent was obtained from the mother who stayed at his side throughout the procedure.  The right nasal passage was sprayed with Afrin and 2% lidocaine.  After a couple of minutes, the fiberoptic laryngoscope was passed gently through the right nasal passage to view the pharynx and larynx.  Findings are noted above.  After completed, the scope was removed and the patient was returned to nursing care without complication.

## 2011-09-23 NOTE — Progress Notes (Signed)
*  PRELIMINARY RESULTS*  EEG has been performed.  Chad Middleton 09/23/2011, 2:17 PM

## 2011-09-24 DIAGNOSIS — R410 Disorientation, unspecified: Secondary | ICD-10-CM | POA: Diagnosis present

## 2011-09-24 LAB — COMPREHENSIVE METABOLIC PANEL
ALT: 22 U/L (ref 0–53)
AST: 19 U/L (ref 0–37)
Albumin: 3.9 g/dL (ref 3.5–5.2)
Calcium: 9.9 mg/dL (ref 8.4–10.5)
Sodium: 135 mEq/L (ref 135–145)
Total Protein: 7.7 g/dL (ref 6.0–8.3)

## 2011-09-24 LAB — CBC
MCHC: 34.1 g/dL (ref 30.0–36.0)
RDW: 12.6 % (ref 11.5–15.5)
WBC: 7.8 10*3/uL (ref 4.0–10.5)

## 2011-09-24 LAB — VALPROIC ACID LEVEL: Valproic Acid Lvl: 49.7 ug/mL — ABNORMAL LOW (ref 50.0–100.0)

## 2011-09-24 MED ORDER — ZOLPIDEM TARTRATE 5 MG PO TABS
5.0000 mg | ORAL_TABLET | Freq: Every evening | ORAL | Status: DC | PRN
  Administered 2011-09-24: 5 mg via ORAL
  Filled 2011-09-24: qty 1

## 2011-09-24 MED ORDER — LEVOFLOXACIN 750 MG PO TABS
750.0000 mg | ORAL_TABLET | Freq: Every day | ORAL | Status: DC
  Administered 2011-09-24 – 2011-09-25 (×2): 750 mg via ORAL
  Filled 2011-09-24 (×2): qty 1

## 2011-09-24 MED ORDER — VITAMINS A & D EX OINT
TOPICAL_OINTMENT | CUTANEOUS | Status: AC
  Filled 2011-09-24: qty 5

## 2011-09-24 MED ORDER — LORAZEPAM 2 MG/ML IJ SOLN
1.0000 mg | Freq: Two times a day (BID) | INTRAMUSCULAR | Status: DC | PRN
  Administered 2011-09-24: 1 mg via INTRAVENOUS

## 2011-09-24 MED ORDER — LORAZEPAM 2 MG/ML IJ SOLN
INTRAMUSCULAR | Status: AC
  Administered 2011-09-24: 1 mg via INTRAVENOUS
  Filled 2011-09-24: qty 1

## 2011-09-24 MED ORDER — LORAZEPAM 2 MG/ML PO CONC: 1.0000 mg | Freq: Two times a day (BID) | ORAL | Status: DC | PRN

## 2011-09-24 MED ORDER — DIVALPROEX SODIUM ER 500 MG PO TB24
1500.0000 mg | ORAL_TABLET | Freq: Every day | ORAL | Status: DC
  Administered 2011-09-24: 1500 mg via ORAL
  Filled 2011-09-24 (×2): qty 3

## 2011-09-24 NOTE — Progress Notes (Signed)
Subjective: Per patient's mother are doing significantly better today.  Patient during the night was agitated had to be restrained.  Patient was also biting his lips and had some mild bleeding from the lips.  Pleasantly confused this morning.  Objective: Vital signs in last 24 hours: Filed Vitals:   09/23/11 1704 09/23/11 2206 09/24/11 0516 09/24/11 1331  BP: 152/85 155/97 137/67 149/104  Pulse: 110 95 125 113  Temp: 98.3 F (36.8 C) 98.4 F (36.9 C) 98.2 F (36.8 C) 98.6 F (37 C)  TempSrc: Oral Axillary Axillary Axillary  Resp: 18 18 17 18   Height:      Weight:      SpO2: 99% 97% 99% 98%   Weight change:   Intake/Output Summary (Last 24 hours) at 09/24/11 1630 Last data filed at 09/24/11 1300  Gross per 24 hour  Intake   1440 ml  Output      0 ml  Net   1440 ml    Physical Exam: General: Awake, No acute distress. HEENT: EOMI, had trace amount of blood on his lips Neck: Supple CV: S1 and S2 Lungs: Clear to ascultation bilaterally Abdomen: Soft, Nontender, Nondistended, +bowel sounds. Ext: Good pulses. Trace edema.  Lab Results:  Basename 09/24/11 0518 09/23/11 0500  NA 135 133*  K 4.2 4.2  CL 98 98  CO2 21 24  GLUCOSE 105* 138*  BUN 17 10  CREATININE 0.99 0.89  CALCIUM 9.9 9.7  MG -- --  PHOS -- --    Basename 09/24/11 0518 09/22/11 2000  AST 19 27  ALT 22 30  ALKPHOS 49 56  BILITOT 0.5 0.4  PROT 7.7 8.2  ALBUMIN 3.9 4.3   No results found for this basename: LIPASE:2,AMYLASE:2 in the last 72 hours  Basename 09/24/11 0518 09/23/11 0500 09/22/11 2000  WBC 7.8 9.1 --  NEUTROABS -- -- 3.8  HGB 14.3 15.3 --  HCT 41.9 44.3 --  MCV 91.1 91.0 --  PLT 323 308 --   No results found for this basename: CKTOTAL:3,CKMB:3,CKMBINDEX:3,TROPONINI:3 in the last 72 hours No components found with this basename: POCBNP:3 No results found for this basename: DDIMER:2 in the last 72 hours No results found for this basename: HGBA1C:2 in the last 72 hours No results  found for this basename: CHOL:2,HDL:2,LDLCALC:2,TRIG:2,CHOLHDL:2,LDLDIRECT:2 in the last 72 hours No results found for this basename: TSH,T4TOTAL,FREET3,T3FREE,THYROIDAB in the last 72 hours No results found for this basename: VITAMINB12:2,FOLATE:2,FERRITIN:2,TIBC:2,IRON:2,RETICCTPCT:2 in the last 72 hours  Micro Results: Recent Results (from the past 240 hour(s))  CULTURE, BLOOD (ROUTINE X 2)     Status: Normal (Preliminary result)   Collection Time   09/22/11  8:25 PM      Component Value Range Status Comment   Specimen Description BLOOD LEFT AC   Final    Special Requests BOTTLES DRAWN AEROBIC AND ANAEROBIC 5 CC EACH   Final    Setup Time [id number]   Final    Culture     Final    Value:        BLOOD CULTURE RECEIVED NO GROWTH TO DATE CULTURE WILL BE HELD FOR 5 DAYS BEFORE ISSUING A FINAL NEGATIVE REPORT   Report Status PENDING   Incomplete   CULTURE, BLOOD (ROUTINE X 2)     Status: Normal (Preliminary result)   Collection Time   09/22/11  8:40 PM      Component Value Range Status Comment   Specimen Description BLOOD LEFT FOREARM   Final    Special Requests  BOTTLES DRAWN AEROBIC AND ANAEROBIC 3 CC EACH   Final    Setup Time [id number]   Final    Culture     Final    Value:        BLOOD CULTURE RECEIVED NO GROWTH TO DATE CULTURE WILL BE HELD FOR 5 DAYS BEFORE ISSUING A FINAL NEGATIVE REPORT   Report Status PENDING   Incomplete   URINE CULTURE     Status: Normal   Collection Time   09/22/11  9:09 PM      Component Value Range Status Comment   Specimen Description URINE, CATHETERIZED   Final    Special Requests NONE   Final    Setup Time [id number]   Final    Colony Count NO GROWTH   Final    Culture NO GROWTH   Final    Report Status 09/23/2011 FINAL   Final     Studies/Results: Dg Neck Soft Tissue  09/22/2011  *RADIOLOGY REPORT*  Clinical Data: Altered mental status.  Wheezing.  NECK SOFT TISSUES - 1+ VIEW  Comparison: None.  Findings: The epiglottis is not well delineated on  the present exam.  The hypopharynx does not appear dilated.  Prominence of soft tissue in the upper posterior-superior nasopharynx may represent adenoidal tissue. If further delineation is clinically desired, CT neck can be performed.  Question of small laryngocele.  IMPRESSION: The epiglottis is not well delineated on the present exam.  The hypopharynx does not appear dilated.  Prominence of soft tissue in the upper posterior-superior nasopharynx may represent adenoidal tissue.  Question of small laryngocele.  Original Report Authenticated By: Fuller Canada, M.D.   Dg Chest 1 View  09/22/2011  *RADIOLOGY REPORT*  Clinical Data: Altered mental status.  Tachycardia.  CHEST - 1 VIEW  Comparison: None.  Findings: Poor inspiration with elevated left hemidiaphragm limiting evaluation of the left base.  Heart size within normal limits.  No pneumothorax or pulmonary edema.  IMPRESSION: Elevated left hemidiaphragm limiting evaluation of the left base.  Original Report Authenticated By: Fuller Canada, M.D.   Ct Head Wo Contrast  09/22/2011  *RADIOLOGY REPORT*  Clinical Data: Altered mental status.  CT HEAD WITHOUT CONTRAST  Technique:  Contiguous axial images were obtained from the base of the skull through the vertex without contrast.  Comparison: None.  Findings: No intracranial hemorrhage.  Global atrophy without hydrocephalus.  The right middle cerebral artery and basilar artery appears slightly dense however, this may be normal rather than representing result of thrombus.  Overall, no CT findings of large acute thrombotic infarct.  CT imaging may not detect small acute infarct.  No intracranial mass lesion detected on this unenhanced exam.  IMPRESSION: Atrophy.  Please see above.  Original Report Authenticated By: Fuller Canada, M.D.   Ct Soft Tissue Neck W Contrast  09/22/2011  *RADIOLOGY REPORT*  Clinical Data: Improving.  Possible infection.  CT NECK WITH CONTRAST  Technique:  Multidetector CT imaging of  the neck was performed with intravenous contrast.  Contrast: OMNIPAQUE IOHEXOL 300 MG/ML IV SOLN  Comparison: 09/22/2011.  Findings: Grossly, visualized intracranial contents appear normal. Globes and orbits grossly appear normal.  Peri apical lucencies are present around the roots of the first molar roots bilaterally in the mandibles, suggesting odontogenic infection.  There is no abscess identified.  Diffuse adenoidal and tonsillar hypertrophy is present.  There is no abscess identified.  Soft tissue swelling is present along the trachea with narrowing of the posterior nasopharynx  due to adenoidal hypertrophy and hypertrophy of the soft palate.  The upper airway narrowing is also present associated with tonsillar hypertrophy.  This extends to the glottis.  Visualized lung apices appear normal.  Infraglottic trachea appears normal.  Parotid glands grossly appear normal.  IMPRESSION: 1.  Significant narrowing of the nasal pharyngeal and hypopharyngeal airway associated with tonsillar hypertrophy.  There is probably mucosal edema extending to the level of the glottis. No complete obstruction of the airway is identified. 2.  Negative for soft tissue abscess. 3.  Peri apical lucencies around the first mandibular molars bilaterally suggesting chronic odontogenic infection.  Consider follow-up dental consultation. 4.  Based on the exuberant tonsillar hypertrophy, question infectious mononucleosis.  Original Report Authenticated By: Andreas Newport, M.D.    Medications: I have reviewed the patient's current medications. Scheduled Meds:   . alprazolam  4 mg Oral QAC breakfast  . asenapine  5 mg Sublingual BID  . desmopressin  0.2 mg Oral QHS  . dexamethasone  10 mg Intravenous Q24H  . divalproex  1,500 mg Oral QHS  . enoxaparin (LOVENOX) injection  70 mg Subcutaneous Q24H  . influenza  inactive virus vaccine  0.5 mL Intramuscular Tomorrow-1000  . levofloxacin  750 mg Oral Daily  . lisdexamfetamine  50 mg  Oral Daily  . sertraline  25 mg Oral Daily  . DISCONTD: ampicillin-sulbactam (UNASYN) IV  3 g Intravenous Q6H   Continuous Infusions:   . DISCONTD: dextrose 5 % and 0.45% NaCl 100 mL/hr at 09/24/11 0500   PRN Meds:.acetaminophen, acetaminophen, LORazepam, ondansetron (ZOFRAN) IV, ondansetron, Racepinephrine HCl, DISCONTD: LORazepam  Assessment/Plan: 1. Acute laryngitis Patient was evaluated by Dr. Jenne Pane, ENT, and patient had direct laryngoscopy which not show any clear etiology/pathology.  Discontinue Unasyn and start the patient on levofloxacin.  Antibiotics since 09/23/2011.  2.  Acute delirium on top of history of mental retardation.  Patient was noted to be lethargic with excessive drooling on admission.  EEG was done and did not show any epileptiform activity.  CT of the head was negative.  Dr. Thad Ranger, neurology evaluated the patient.  Given improvement in mentation, and per mother is close to baseline, no further workup is needed at this time.  3.  Elevated Depakote levels.  Resolved this morning.  Restart the patient on Depakote.  4.  History of mental retardation with behavioral disturbance.  Resume home medications.  5.  History of ADHD.  On home medications.  6.  Disposition.  Pending.  PT/OT consult ordered given weakness.  Consider possible discharge over the next one to 2 days if the patient continues to improve clinically.   LOS: 2 days  Azaiah Mello A, MD 09/24/2011, 4:30 PM

## 2011-09-24 NOTE — Progress Notes (Signed)
CARE MANAGEMENT NOTE 09/24/2011  Patient:  [facility]   Account Number:  [id number]  Date Initiated:  09/24/2011  Documentation initiated by:  DAVIS,RHONDA  Subjective/Objective Assessment:   26 yo male with hx of mental retardation found slumped over in chair at the home,drooling from mouth, infectious epiglottis with airway compromise found     Action/Plan:   lives at home with mother   Anticipated DC Date:  09/27/2011   Anticipated DC Plan:  HOME/SELF CARE  In-house referral  NA      DC Planning Services  NA      [facility] Choice  NA   Choice offered to / List presented to:  NA   DME arranged  NA      DME agency  NA     HH arranged  NA      HH agency  NA   Status of service:  In process, will continue to follow Medicare Important Message given?  NA - LOS <3 / Initial given by admissions (If response is "NO", the following Medicare IM given date fields will be blank) Date Medicare IM given:   Date Additional Medicare IM given:    Discharge Disposition:    Per UR Regulation:  Reviewed for med. necessity/level of care/duration of stay  Comments:  [id number]/Rhonda DAvis,RN,BSN,CCM

## 2011-09-24 NOTE — Progress Notes (Signed)
Pt very restless and agitated throughout night. Pt would bang arms against bed rails, pulled out IV, biting on lips until lips started to bleed. Orders received to give 1mg  ativan IV, pt still restless. VS stable, will continue to monitor pt.

## 2011-09-24 NOTE — Progress Notes (Signed)
Nutrition Brief Note:  Consult received for pt with dysphagia.  Pt seen and assessed by SLP, who recommended Regular with thin liquids.  Pt received lunch today, consumed 100%.  Family present at bedside state pt eats well, and has a good appetite.  Pt confirms he ate his lunch today and liked it.  No nutrition dx or interventions warranted at this time.  RD will continue to follow and screen for changes in intake or diet tolerance. Family requested resources for personal nutrition goals which were provided.  Pager: [phone]

## 2011-09-24 NOTE — Progress Notes (Signed)
Speech Language/Pathology Clinical/Bedside Swallow Evaluation Patient Details  Name: Chad Middleton MRN: [id number] DOB: [birth year] Today's Date: 09/24/2011  Past Medical History:  Past Medical History  Diagnosis Date  . Mental retardation   . OSA (obstructive sleep apnea)   . ADHD (attention deficit hyperactivity disorder)   . Anxiety   . Incontinent of urine    Past Surgical History:  Past Surgical History  Procedure Date  . No past surgeries    HPI:  26 year old male with h/o of moderate mental retardation admitted with pharyngeal infection, edema with narrowing of naso and hypo pharynx c/w tonsil hypertrophy and mucosal edema to glottis due to acute infection.PMH +  MR, anxiety, urinary incontinence. Per his mother, pt is back to his baseline with minimal talking and he is requesting food. His lower lip is very swollen  as he had some agitation last night and repeatedly bit his lip per chart. Mother denies h/o swallowing difficulty.   Assessment/Recommendations/Treatment Plan   Clinical Impression Statement: No overt s/s of aspiration. No indication that food was sticking in his throat. Mother reported eating/swallowing is at baseline. Pt is impulsive and drinks and eats rapidly with large bites/sips. This is also baseline per mom. Risk for Aspiration: Mild Other Related Risk Factors: Cognitive impairment  Swallow Recommendations Solid Consistency: Regular Liquid Consistency: Thin Liquid Administration via: No straw Medication Administration: Whole meds with liquid Supervision: Patient able to self feed;Full supervision/cueing for compensatory strategies Compensations: Slow rate Postural Changes and/or Swallow Maneuvers: Seated upright 90 degrees;Out of bed for meals Oral Care Recommendations: Oral care BID Follow up Recommendations: None  Treatment Plan Speech Therapy Frequency: min 2x/week Treatment Duration: 2 weeks Interventions: Diet toleration management by  SLP;Patient/family education  Prognosis Prognosis for Safe Diet Advancement: Good Barriers to Reach Goals: Cognitive deficits  Individuals Consulted Consulted and Agree with Results and Recommendations: Family member/caregiver Family Member Consulted: mother  Swallowing Goals  SLP Swallowing Goals Patient will consume recommended diet without observed clinical signs of aspiration with: Moderate assistance Patient will utilize recommended strategies during swallow to increase swallowing safety with: Moderate assistance  Swallow Study Prior Functional Status no MBSS in e chart    General  Date of Onset: 09/22/11 HPI: 26 year old male with h/o of moderate mental retardation admitted with pharyngeal infection, edema with narrowing of naso and hypo pharynx c/w tonsil hypertrophy and mucosal edema to glottis due to acute infection.PMH +  MR, anxiety, urinary incontinence. Per his mother, pt is back to his baseline with minimal talking and he is requesting food. His lower lip is very swollen  as he had some agitation last night and repeatedly bit his lip per chart. Mother denies h/o swallowing difficulty. Type of Study: Bedside swallow evaluation Diet Prior to this Study: Regular;Thin liquids Temperature Spikes Noted: No Respiratory Status: Room air Behavior/Cognition: Alert;Cooperative;Distractible;Requires cueing Oral Cavity - Dentition: Adequate natural dentition Vision: Functional for self-feeding Patient Positioning: Upright in bed Baseline Vocal Quality: Normal Volitional Cough: Strong Volitional Swallow: Able to elicit Ice chips: Tested (comment)  Oral Motor/Sensory Function  Overall Oral Motor/Sensory Function: Appears within functional limits for tasks assessed  Consistency Results  Ice Chips Ice chips: Within functional limits  Thin Liquid Thin Liquid: Within functional limits Presentation: Cup;Self Fed;Straw  Nectar Thick Liquid Nectar Thick Liquid: Not  tested  Honey Thick Liquid Honey Thick Liquid: Not tested  Puree Puree: Within functional limits Presentation: Self Fed;Spoon  Solid Solid: Within functional limits Presentation: Self Fed;Spoon Other Comments: fruit cocktail  and grahams with peanut butter   Lovvorn, Radene Journey 09/24/2011,1:00 PM

## 2011-09-24 NOTE — Consult Note (Signed)
Reason for Consult:altered mental status Referring Physician: Betti Cruz  CC: altered mental status  HPI: Chad Middleton is an 26 y.o. male with history of MR who usually lives at home with family.  Was noted on the day of admission to be less verbal and lethargic.  Was then noted to drool excessively and slump over.  No tonic-clonic activity was noted.  Patient was brought in for evaluation at that time and admitted secondary to an acute laryngitis.  Mother reports has had some improvement in his mental status but had not returned to baseline.  At home patient on Depakote and Xanax for behavior.  Has no history of seizures.    Past Medical History  Diagnosis Date  . Mental retardation   . OSA (obstructive sleep apnea)   . ADHD (attention deficit hyperactivity disorder)   . Anxiety   . Incontinent of urine     Past Surgical History  Procedure Date  . No past surgeries     History reviewed. No pertinent family history.  Social History:  reports that he has never smoked. He has never used smokeless tobacco. He reports that he does not drink alcohol or use illicit drugs.  No Known Allergies  Medications:  I have reviewed the patient's current medications. Prior to Admission:  Prescriptions prior to admission  Medication Sig Dispense Refill  . alprazolam (XANAX) 2 MG tablet Take 2 tablets by mouth every morning       . asenapine (SAPHRIS) 5 MG SUBL Place 5 mg under the tongue 2 (two) times daily.        Marland Kitchen desmopressin (DDAVP) 0.2 MG tablet Take 0.2 mg by mouth at bedtime.        Marland Kitchen Dextromethorphan-Guaifenesin (MUCUS RELIEF DM) 20-400 MG TABS Take 1 tablet by mouth every 4 (four) hours as needed.        . divalproex (DEPAKOTE ER) 500 MG 24 hr tablet 500 mg. Take 3 tablets by mouth at bedtime      . lisdexamfetamine (VYVANSE) 50 MG capsule Take 50 mg by mouth every morning.        . loratadine (CLARITIN) 10 MG tablet Take 10 mg by mouth daily.        . protriptyline (VIVACTIL) 5 MG tablet  TAKE 1 TABLET AT BEDTIME FOR SNORING  15 tablet  5  . sertraline (ZOLOFT) 25 MG tablet Take 25 mg by mouth daily.         Scheduled:   . alprazolam  4 mg Oral QAC breakfast  . ampicillin-sulbactam (UNASYN) IV  3 g Intravenous Q6H  . asenapine  5 mg Sublingual BID  . desmopressin  0.2 mg Oral QHS  . dexamethasone  10 mg Intravenous Q24H  . enoxaparin (LOVENOX) injection  70 mg Subcutaneous Q24H  . influenza  inactive virus vaccine  0.5 mL Intramuscular Tomorrow-1000  . lidocaine      . lisdexamfetamine  50 mg Oral Daily  . oxymetazoline  1 spray Each Nare Once  . sertraline  25 mg Oral Daily  . DISCONTD: enoxaparin  40 mg Subcutaneous Q24H    ROS: History obtained from mother  General ROS: negative for - chills, fatigue, fever, night sweats, weight gain or weight loss Psychological ROS: behavioral disorder, mood swings  Ophthalmic ROS: negative for - blurry vision, double vision, eye pain or loss of vision ENT ROS: negative for - epistaxis, nasal discharge, oral lesions, sore throat, tinnitus or vertigo Allergy and Immunology ROS: negative for - hives or  itchy/watery eyes Hematological and Lymphatic ROS: negative for - bleeding problems, bruising or swollen lymph nodes Endocrine ROS: negative for - galactorrhea, hair pattern changes, polydipsia/polyuria or temperature intolerance Respiratory ROS: negative for - cough, hemoptysis, shortness of breath or wheezing Cardiovascular ROS: negative for - chest pain, dyspnea on exertion, edema or irregular heartbeat Gastrointestinal ROS: negative for - abdominal pain, diarrhea, hematemesis, nausea/vomiting or stool incontinence Genito-Urinary ROS:  incontinence  Musculoskeletal ROS: negative for - joint swelling or muscular weakness Neurological ROS: as noted in HPI Dermatological ROS: negative for rash and skin lesion changes  Physical Examination: Blood pressure 155/97, pulse 95, temperature 98.4 F (36.9 C), temperature source  Axillary, resp. rate 18, height 6\' 2"  (1.88 m), weight 144.244 kg (318 lb), SpO2 97.00%.  Neurologic Examination Mental Status: Patient lethargic and nonverbal but per report of mother spoke to his father earlier today.  Follows minimal simple commands.   Cranial Nerves: II: Pupils equal, round, reactive to light and accommodation III,IV, VI: ptosis not present, extra-ocular motions grossly intact bilaterally V,VII: strong eye closure bilaterally VIII: hearing normal bilaterally IX,X: gag reflex not tested XI: trapezius strength unable to test XII: tongue strength unable to test  Motor: Lifts all extremities off of the bed.  Does not give full strength but no focality noted.   No atrophy noted Sensory: Responds to light noxious stimuli in all extremities Deep Tendon Reflexes: 2+ and symmetric throughout Plantars: Right: mute   Left: mute Cerebellar: Unable to perform  Results for orders placed during the hospital encounter of 09/22/11 (from the past 48 hour(s))  GLUCOSE, CAPILLARY     Status: Normal   Collection Time   09/22/11  7:49 PM      Component Value Range Comment   Glucose-Capillary 91  70 - 99 (mg/dL)   CBC     Status: Normal   Collection Time   09/22/11  8:00 PM      Component Value Range Comment   WBC 5.9  4.0 - 10.5 (K/uL)    RBC 4.72  4.22 - 5.81 (MIL/uL)    Hemoglobin 14.4  13.0 - 17.0 (g/dL)    HCT 16.1  09.6 - 04.5 (%)    MCV 90.3  78.0 - 100.0 (fL)    MCH 30.5  26.0 - 34.0 (pg)    MCHC 33.8  30.0 - 36.0 (g/dL)    RDW 40.9  81.1 - 91.4 (%)    Platelets 293  150 - 400 (K/uL)   DIFFERENTIAL     Status: Normal   Collection Time   09/22/11  8:00 PM      Component Value Range Comment   Neutrophils Relative 65  43 - 77 (%)    Neutro Abs 3.8  1.7 - 7.7 (K/uL)    Lymphocytes Relative 27  12 - 46 (%)    Lymphs Abs 1.6  0.7 - 4.0 (K/uL)    Monocytes Relative 7  3 - 12 (%)    Monocytes Absolute 0.4  0.1 - 1.0 (K/uL)    Eosinophils Relative 1  0 - 5 (%)     Eosinophils Absolute 0.0  0.0 - 0.7 (K/uL)    Basophils Relative 0  0 - 1 (%)    Basophils Absolute 0.0  0.0 - 0.1 (K/uL)   VALPROIC ACID LEVEL     Status: Abnormal   Collection Time   09/22/11  8:00 PM      Component Value Range Comment   Valproic Acid Lvl 117.2 (*)  50.0 - 100.0 (ug/mL)   PROCALCITONIN     Status: Normal   Collection Time   09/22/11  8:00 PM      Component Value Range Comment   Procalcitonin <0.10     COMPREHENSIVE METABOLIC PANEL     Status: Normal   Collection Time   09/22/11  8:00 PM      Component Value Range Comment   Sodium 135  135 - 145 (mEq/L)    Potassium 4.1  3.5 - 5.1 (mEq/L) NO VISIBLE HEMOLYSIS   Chloride 97  96 - 112 (mEq/L)    CO2 24  19 - 32 (mEq/L)    Glucose, Bld 90  70 - 99 (mg/dL)    BUN 11  6 - 23 (mg/dL)    Creatinine, Ser 1.61  0.50 - 1.35 (mg/dL)    Calcium 9.9  8.4 - 10.5 (mg/dL)    Total Protein 8.2  6.0 - 8.3 (g/dL)    Albumin 4.3  3.5 - 5.2 (g/dL)    AST 27  0 - 37 (U/L) NO VISIBLE HEMOLYSIS   ALT 30  0 - 53 (U/L)    Alkaline Phosphatase 56  39 - 117 (U/L)    Total Bilirubin 0.4  0.3 - 1.2 (mg/dL)    GFR calc non Af Amer >90  >90 (mL/min)    GFR calc Af Amer >90  >90 (mL/min)   ETHANOL     Status: Normal   Collection Time   09/22/11  8:00 PM      Component Value Range Comment   Alcohol, Ethyl (B) <11  0 - 11 (mg/dL)   MONONUCLEOSIS SCREEN     Status: Normal   Collection Time   09/22/11  8:00 PM      Component Value Range Comment   Mono Screen NEGATIVE  NEGATIVE    LACTIC ACID, PLASMA     Status: Normal   Collection Time   09/22/11  8:45 PM      Component Value Range Comment   Lactic Acid, Venous 2.2  0.5 - 2.2 (mmol/L)   AMMONIA     Status: Abnormal   Collection Time   09/22/11  8:45 PM      Component Value Range Comment   Ammonia 61 (*) 11 - 60 (umol/L)   URINALYSIS, ROUTINE W REFLEX MICROSCOPIC     Status: Abnormal   Collection Time   09/22/11  9:09 PM      Component Value Range Comment   Color, Urine AMBER (*) YELLOW   BIOCHEMICALS MAY BE AFFECTED BY COLOR   APPearance CLEAR  CLEAR     Specific Gravity, Urine 1.034 (*) 1.005 - 1.030     pH 6.0  5.0 - 8.0     Glucose, UA NEGATIVE  NEGATIVE (mg/dL)    Hgb urine dipstick NEGATIVE  NEGATIVE     Bilirubin Urine SMALL (*) NEGATIVE     Ketones, ur 15 (*) NEGATIVE (mg/dL)    Protein, ur 30 (*) NEGATIVE (mg/dL)    Urobilinogen, UA 1.0  0.0 - 1.0 (mg/dL)    Nitrite NEGATIVE  NEGATIVE     Leukocytes, UA NEGATIVE  NEGATIVE    URINE CULTURE     Status: Normal   Collection Time   09/22/11  9:09 PM      Component Value Range Comment   Specimen Description URINE, CATHETERIZED      Special Requests NONE      Setup Time [id number]      Colony  Count NO GROWTH      Culture NO GROWTH      Report Status 09/23/2011 FINAL     URINE RAPID DRUG SCREEN (HOSP PERFORMED)     Status: Abnormal   Collection Time   09/22/11  9:09 PM      Component Value Range Comment   Opiates NONE DETECTED  NONE DETECTED     Cocaine NONE DETECTED  NONE DETECTED     Benzodiazepines POSITIVE (*) NONE DETECTED     Amphetamines POSITIVE (*) NONE DETECTED     Tetrahydrocannabinol NONE DETECTED  NONE DETECTED     Barbiturates NONE DETECTED  NONE DETECTED    URINE MICROSCOPIC-ADD ON     Status: Normal   Collection Time   09/22/11  9:09 PM      Component Value Range Comment   Squamous Epithelial / LPF RARE  RARE     WBC, UA 0-2  <3 (WBC/hpf)    RBC / HPF 0-2  <3 (RBC/hpf)    Bacteria, UA RARE  RARE     Urine-Other MUCOUS PRESENT     BASIC METABOLIC PANEL     Status: Abnormal   Collection Time   09/23/11  5:00 AM      Component Value Range Comment   Sodium 133 (*) 135 - 145 (mEq/L)    Potassium 4.2  3.5 - 5.1 (mEq/L)    Chloride 98  96 - 112 (mEq/L)    CO2 24  19 - 32 (mEq/L)    Glucose, Bld 138 (*) 70 - 99 (mg/dL)    BUN 10  6 - 23 (mg/dL)    Creatinine, Ser 1.61  0.50 - 1.35 (mg/dL)    Calcium 9.7  8.4 - 10.5 (mg/dL)    GFR calc non Af Amer >90  >90 (mL/min)    GFR calc Af Amer >90  >90  (mL/min)   CBC     Status: Normal   Collection Time   09/23/11  5:00 AM      Component Value Range Comment   WBC 9.1  4.0 - 10.5 (K/uL)    RBC 4.87  4.22 - 5.81 (MIL/uL)    Hemoglobin 15.3  13.0 - 17.0 (g/dL)    HCT 09.6  04.5 - 40.9 (%)    MCV 91.0  78.0 - 100.0 (fL)    MCH 31.4  26.0 - 34.0 (pg)    MCHC 34.5  30.0 - 36.0 (g/dL)    RDW 81.1  91.4 - 78.2 (%)    Platelets 308  150 - 400 (K/uL)     Recent Results (from the past 240 hour(s))  URINE CULTURE     Status: Normal   Collection Time   09/22/11  9:09 PM      Component Value Range Status Comment   Specimen Description URINE, CATHETERIZED   Final    Special Requests NONE   Final    Setup Time [id number]   Final    Colony Count NO GROWTH   Final    Culture NO GROWTH   Final    Report Status 09/23/2011 FINAL   Final     Dg Neck Soft Tissue  09/22/2011  *RADIOLOGY REPORT*  Clinical Data: Altered mental status.  Wheezing.  NECK SOFT TISSUES - 1+ VIEW  Comparison: None.  Findings: The epiglottis is not well delineated on the present exam.  The hypopharynx does not appear dilated.  Prominence of soft tissue in the upper posterior-superior nasopharynx may represent adenoidal tissue.  If further delineation is clinically desired, CT neck can be performed.  Question of small laryngocele.  IMPRESSION: The epiglottis is not well delineated on the present exam.  The hypopharynx does not appear dilated.  Prominence of soft tissue in the upper posterior-superior nasopharynx may represent adenoidal tissue.  Question of small laryngocele.  Original Report Authenticated By: Fuller Canada, M.D.   Dg Chest 1 View  09/22/2011  *RADIOLOGY REPORT*  Clinical Data: Altered mental status.  Tachycardia.  CHEST - 1 VIEW  Comparison: None.  Findings: Poor inspiration with elevated left hemidiaphragm limiting evaluation of the left base.  Heart size within normal limits.  No pneumothorax or pulmonary edema.  IMPRESSION: Elevated left hemidiaphragm limiting  evaluation of the left base.  Original Report Authenticated By: Fuller Canada, M.D.   Ct Head Wo Contrast  09/22/2011  *RADIOLOGY REPORT*  Clinical Data: Altered mental status.  CT HEAD WITHOUT CONTRAST  Technique:  Contiguous axial images were obtained from the base of the skull through the vertex without contrast.  Comparison: None.  Findings: No intracranial hemorrhage.  Global atrophy without hydrocephalus.  The right middle cerebral artery and basilar artery appears slightly dense however, this may be normal rather than representing result of thrombus.  Overall, no CT findings of large acute thrombotic infarct.  CT imaging may not detect small acute infarct.  No intracranial mass lesion detected on this unenhanced exam.  IMPRESSION: Atrophy.  Please see above.  Original Report Authenticated By: Fuller Canada, M.D.   Ct Soft Tissue Neck W Contrast  09/22/2011  *RADIOLOGY REPORT*  Clinical Data: Improving.  Possible infection.  CT NECK WITH CONTRAST  Technique:  Multidetector CT imaging of the neck was performed with intravenous contrast.  Contrast: OMNIPAQUE IOHEXOL 300 MG/ML IV SOLN  Comparison: 09/22/2011.  Findings: Grossly, visualized intracranial contents appear normal. Globes and orbits grossly appear normal.  Peri apical lucencies are present around the roots of the first molar roots bilaterally in the mandibles, suggesting odontogenic infection.  There is no abscess identified.  Diffuse adenoidal and tonsillar hypertrophy is present.  There is no abscess identified.  Soft tissue swelling is present along the trachea with narrowing of the posterior nasopharynx due to adenoidal hypertrophy and hypertrophy of the soft palate.  The upper airway narrowing is also present associated with tonsillar hypertrophy.  This extends to the glottis.  Visualized lung apices appear normal.  Infraglottic trachea appears normal.  Parotid glands grossly appear normal.  IMPRESSION: 1.  Significant narrowing of  the nasal pharyngeal and hypopharyngeal airway associated with tonsillar hypertrophy.  There is probably mucosal edema extending to the level of the glottis. No complete obstruction of the airway is identified. 2.  Negative for soft tissue abscess. 3.  Peri apical lucencies around the first mandibular molars bilaterally suggesting chronic odontogenic infection.  Consider follow-up dental consultation. 4.  Based on the exuberant tonsillar hypertrophy, question infectious mononucleosis.  Original Report Authenticated By: Andreas Newport, M.D.     Assessment/Plan:  Patient Active Hospital Problem List: Altered Mental Status   Assessment:  Patient with an episode of decreased awareness and altered mental status.  Seizure on the differential but history not diagnostic.  CT unremarkable.  EEG shows no epileptiform activity and patient on Depakote with a therapeutic level.  Patient has a concurrent infection and with its treatment the family reports that the patient has had some modest improvement.     Plan:  Agree with continued treatment of infection.  Would  not initiate treatment for seizures at this time. Elevated VPA level   Assessment: Patient on Depakote for behavior issues.  Level slightly elevated.  Level in an of itself is of no concern but patient has elevated ammonia. This may very well be a consequence of the Depakote despite the fact that the LFT's are not elevated.   Plan:  Would restart Depakote at 1000mg  at bedtime and follow ammonia.   Thana Farr, MD [facility] [phone] 09/24/2011, 1:55 AM

## 2011-09-25 LAB — CBC
HCT: 39.5 % (ref 39.0–52.0)
MCV: 91.2 fL (ref 78.0–100.0)
RDW: 12.3 % (ref 11.5–15.5)
WBC: 8.5 10*3/uL (ref 4.0–10.5)

## 2011-09-25 LAB — BASIC METABOLIC PANEL
BUN: 22 mg/dL (ref 6–23)
CO2: 24 mEq/L (ref 19–32)
Chloride: 97 mEq/L (ref 96–112)
Creatinine, Ser: 1.05 mg/dL (ref 0.50–1.35)
Glucose, Bld: 101 mg/dL — ABNORMAL HIGH (ref 70–99)

## 2011-09-25 MED ORDER — VITAMINS A & D EX OINT
TOPICAL_OINTMENT | CUTANEOUS | Status: AC
  Administered 2011-09-25: 16:00:00
  Filled 2011-09-25: qty 5

## 2011-09-25 MED ORDER — LEVOFLOXACIN 750 MG PO TABS: 750.0000 mg | ORAL_TABLET | Freq: Every day | ORAL | Status: AC

## 2011-09-25 NOTE — Discharge Summary (Signed)
Discharge Summary  Burtis Imhoff MR#: [id number]  DOB:[birth year]  Date of Admission: 09/22/2011 Date of Discharge: 09/25/2011  Patient's PCP: Carolan Shiver, MD, MD  Attending Physician:Wilfred Siverson A  Pulmonologist: Dr. Maple Hudson  Consults: Dr. Antony Contras, ENT Dr. Thad Ranger, Neurology  Discharge Diagnoses: Principal Problem:  *Acute laryngitis Active Problems:  ADHD  MODERATE MENTAL RETARDATION  OBSTRUCTIVE SLEEP APNEA  Acute delirium  Swollen lower lip due to trauma from biting his own lip.  Brief Admitting History and Physical 26 year old [place] American gentleman with history of moderate mental retardation who presented on 09/22/2011 with lethargy and increased drooling.  Discharge Medications Current Discharge Medication List    START taking these medications   Details  levofloxacin (LEVAQUIN) 750 MG tablet Take 1 tablet (750 mg total) by mouth daily. Qty: 5 tablet, Refills: 0      CONTINUE these medications which have NOT CHANGED   Details  alprazolam (XANAX) 2 MG tablet Take 2 tablets by mouth every morning     asenapine (SAPHRIS) 5 MG SUBL Place 5 mg under the tongue 2 (two) times daily.      desmopressin (DDAVP) 0.2 MG tablet Take 0.2 mg by mouth at bedtime.      Dextromethorphan-Guaifenesin (MUCUS RELIEF DM) 20-400 MG TABS Take 1 tablet by mouth every 4 (four) hours as needed.      divalproex (DEPAKOTE ER) 500 MG 24 hr tablet 500 mg. Take 3 tablets by mouth at bedtime    lisdexamfetamine (VYVANSE) 50 MG capsule Take 50 mg by mouth every morning.      loratadine (CLARITIN) 10 MG tablet Take 10 mg by mouth daily.      protriptyline (VIVACTIL) 5 MG tablet TAKE 1 TABLET AT BEDTIME FOR SNORING Qty: 15 tablet, Refills: 5    sertraline (ZOLOFT) 25 MG tablet Take 25 mg by mouth daily.          Hospital Course: 1. Acute laryngitis. Patient was evaluated by Dr. Jenne Pane, ENT, and patient had direct laryngoscopy which not show any clear etiology/pathology.   Patient was started on initially empiric Unasyn which was transitioned to levofloxacin.  Patient will need 5 more days course of antibiotics to complete at least a seven-day course.  Antibiotics since 09/23/2011.  2.  Acute delirium on top of history of mental retardation.  Patient was noted to be lethargic with excessive drooling on admission.  EEG was done and did not show any epileptiform activity.  CT of the head was negative.  Dr. Thad Ranger, neurology evaluated the patient.  Given improvement in mentation, and per mother is close to baseline, no further workup is needed at this time.  Initially the patient needed restraints during the night of January 9 of 2013, however since the morning of 09/24/2011 the patient did not require any restraints.  3.  Swollen lower lip.  Due to trauma from biting his own lip when the patient was agitated and confused.  4.  Elevated Depakote levels.  Resolved this morning.  Restart the patient on Depakote.  5.  History of mental retardation with behavioral disturbance.  Resume home medications.  6.  History of ADHD.  On home medications.  7.  Generalized weakness and deconditioning.  Patient was evaluated by physical therapy and will arrange for home health physical therapy at discharge.    Day of Discharge BP 140/89  Pulse 118  Temp(Src) 97.5 F (36.4 C) (Axillary)  Resp 18  Ht 6\' 2"  (1.88 m)  Wt 138.1 kg (304 lb 7.3 oz)  BMI 39.09 kg/m2  SpO2 100%  Results for orders placed during the hospital encounter of 09/22/11 (from the past 48 hour(s))  CBC     Status: Normal   Collection Time   09/24/11  5:18 AM      Component Value Range Comment   WBC 7.8  4.0 - 10.5 (K/uL)    RBC 4.60  4.22 - 5.81 (MIL/uL)    Hemoglobin 14.3  13.0 - 17.0 (g/dL)    HCT 16.1  09.6 - 04.5 (%)    MCV 91.1  78.0 - 100.0 (fL)    MCH 31.1  26.0 - 34.0 (pg)    MCHC 34.1  30.0 - 36.0 (g/dL)    RDW 40.9  81.1 - 91.4 (%)    Platelets 323  150 - 400 (K/uL)   VALPROIC ACID LEVEL      Status: Abnormal   Collection Time   09/24/11  5:18 AM      Component Value Range Comment   Valproic Acid Lvl 49.7 (*) 50.0 - 100.0 (ug/mL)   COMPREHENSIVE METABOLIC PANEL     Status: Abnormal   Collection Time   09/24/11  5:18 AM      Component Value Range Comment   Sodium 135  135 - 145 (mEq/L)    Potassium 4.2  3.5 - 5.1 (mEq/L)    Chloride 98  96 - 112 (mEq/L)    CO2 21  19 - 32 (mEq/L)    Glucose, Bld 105 (*) 70 - 99 (mg/dL)    BUN 17  6 - 23 (mg/dL)    Creatinine, Ser 7.82  0.50 - 1.35 (mg/dL)    Calcium 9.9  8.4 - 10.5 (mg/dL)    Total Protein 7.7  6.0 - 8.3 (g/dL)    Albumin 3.9  3.5 - 5.2 (g/dL)    AST 19  0 - 37 (U/L)    ALT 22  0 - 53 (U/L)    Alkaline Phosphatase 49  39 - 117 (U/L)    Total Bilirubin 0.5  0.3 - 1.2 (mg/dL)    GFR calc non Af Amer >90  >90 (mL/min)    GFR calc Af Amer >90  >90 (mL/min)   BASIC METABOLIC PANEL     Status: Abnormal   Collection Time   09/25/11  4:55 AM      Component Value Range Comment   Sodium 131 (*) 135 - 145 (mEq/L)    Potassium 3.7  3.5 - 5.1 (mEq/L)    Chloride 97  96 - 112 (mEq/L)    CO2 24  19 - 32 (mEq/L)    Glucose, Bld 101 (*) 70 - 99 (mg/dL)    BUN 22  6 - 23 (mg/dL)    Creatinine, Ser 9.56  0.50 - 1.35 (mg/dL)    Calcium 9.0  8.4 - 10.5 (mg/dL)    GFR calc non Af Amer >90  >90 (mL/min)    GFR calc Af Amer >90  >90 (mL/min)   CBC     Status: Normal   Collection Time   09/25/11  4:55 AM      Component Value Range Comment   WBC 8.5  4.0 - 10.5 (K/uL)    RBC 4.33  4.22 - 5.81 (MIL/uL)    Hemoglobin 13.5  13.0 - 17.0 (g/dL)    HCT 21.3  08.6 - 57.8 (%)    MCV 91.2  78.0 - 100.0 (fL)    MCH 31.2  26.0 - 34.0 (pg)  MCHC 34.2  30.0 - 36.0 (g/dL)    RDW 40.9  81.1 - 91.4 (%)    Platelets 279  150 - 400 (K/uL)   AMMONIA     Status: Abnormal   Collection Time   09/25/11  9:22 AM      Component Value Range Comment   Ammonia 63 (*) 11 - 60 (umol/L)     Dg Neck Soft Tissue  09/22/2011  *RADIOLOGY REPORT*  Clinical  Data: Altered mental status.  Wheezing.  NECK SOFT TISSUES - 1+ VIEW  Comparison: None.  Findings: The epiglottis is not well delineated on the present exam.  The hypopharynx does not appear dilated.  Prominence of soft tissue in the upper posterior-superior nasopharynx may represent adenoidal tissue. If further delineation is clinically desired, CT neck can be performed.  Question of small laryngocele.  IMPRESSION: The epiglottis is not well delineated on the present exam.  The hypopharynx does not appear dilated.  Prominence of soft tissue in the upper posterior-superior nasopharynx may represent adenoidal tissue.  Question of small laryngocele.  Original Report Authenticated By: Fuller Canada, M.D.   Dg Chest 1 View  09/22/2011  *RADIOLOGY REPORT*  Clinical Data: Altered mental status.  Tachycardia.  CHEST - 1 VIEW  Comparison: None.  Findings: Poor inspiration with elevated left hemidiaphragm limiting evaluation of the left base.  Heart size within normal limits.  No pneumothorax or pulmonary edema.  IMPRESSION: Elevated left hemidiaphragm limiting evaluation of the left base.  Original Report Authenticated By: Fuller Canada, M.D.   Ct Head Wo Contrast  09/22/2011  *RADIOLOGY REPORT*  Clinical Data: Altered mental status.  CT HEAD WITHOUT CONTRAST  Technique:  Contiguous axial images were obtained from the base of the skull through the vertex without contrast.  Comparison: None.  Findings: No intracranial hemorrhage.  Global atrophy without hydrocephalus.  The right middle cerebral artery and basilar artery appears slightly dense however, this may be normal rather than representing result of thrombus.  Overall, no CT findings of large acute thrombotic infarct.  CT imaging may not detect small acute infarct.  No intracranial mass lesion detected on this unenhanced exam.  IMPRESSION: Atrophy.  Please see above.  Original Report Authenticated By: Fuller Canada, M.D.   Ct Soft Tissue Neck W  Contrast  09/22/2011  *RADIOLOGY REPORT*  Clinical Data: Improving.  Possible infection.  CT NECK WITH CONTRAST  Technique:  Multidetector CT imaging of the neck was performed with intravenous contrast.  Contrast: OMNIPAQUE IOHEXOL 300 MG/ML IV SOLN  Comparison: 09/22/2011.  Findings: Grossly, visualized intracranial contents appear normal. Globes and orbits grossly appear normal.  Peri apical lucencies are present around the roots of the first molar roots bilaterally in the mandibles, suggesting odontogenic infection.  There is no abscess identified.  Diffuse adenoidal and tonsillar hypertrophy is present.  There is no abscess identified.  Soft tissue swelling is present along the trachea with narrowing of the posterior nasopharynx due to adenoidal hypertrophy and hypertrophy of the soft palate.  The upper airway narrowing is also present associated with tonsillar hypertrophy.  This extends to the glottis.  Visualized lung apices appear normal.  Infraglottic trachea appears normal.  Parotid glands grossly appear normal.  IMPRESSION: 1.  Significant narrowing of the nasal pharyngeal and hypopharyngeal airway associated with tonsillar hypertrophy.  There is probably mucosal edema extending to the level of the glottis. No complete obstruction of the airway is identified. 2.  Negative for soft tissue abscess. 3.  Peri apical lucencies around the first mandibular molars bilaterally suggesting chronic odontogenic infection.  Consider follow-up dental consultation. 4.  Based on the exuberant tonsillar hypertrophy, question infectious mononucleosis.  Original Report Authenticated By: Andreas Newport, M.D.   EEG on 09/23/2011 EEG shows no epileptiform activity.  Disposition: Home with home health PT.  Diet: Regular as tolerated  Activity: Resume as per physical therapy.   Follow-up Appts: Followup with Carolan Shiver, MD (PCP) in 1 week.  TESTS THAT NEED FOLLOW-UP None  Time spent on discharge,  talking to the patient, and coordinating care: 35 mins.   Signed: Cristal Ford, MD 09/25/2011, 2:20 PM

## 2011-09-25 NOTE — Progress Notes (Signed)
                                       [facility] PROGRESS NOTE    SUBJECTIVE   No complaints, obeys all commands, only complaining of sore lower lip.  OBJECTIVE   Vital signs in last 24 hours: Temp:  [97.1 F (36.2 C)-98.6 F (37 C)] 98.6 F (37 C) (01/11 0645) Pulse Rate:  [94-118] 94  (01/11 0645) Resp:  [17-18] 18  (01/11 0645) BP: (137-150)/(86-104) 137/86 mmHg (01/11 0645) SpO2:  [96 %-98 %] 98 % (01/11 0645) Weight:  [138.1 kg (304 lb 7.3 oz)] 138.1 kg (304 lb 7.3 oz) (01/11 0645)  Intake/Output from previous day: 01/10 0701 - 01/11 0700 In: 480 [P.O.:480] Out: -  Intake/Output this shift:   Nutritional status: General  Past Medical History  Diagnosis Date  . Mental retardation   . OSA (obstructive sleep apnea)   . ADHD (attention deficit hyperactivity disorder)   . Anxiety   . Incontinent of urine     Neurologic Exam:  Mental Status: Alert, follows commands.  Speech clear.   Cranial Nerves: II-Visual fields grossly intact. III/IV/VI-Extraocular movements intact.  Pupils reactive bilaterally. V/VII-Smile symmetric VIII-grossly intact XII-midline tongue extension Motor: 4/5 bilaterally lifting extremities off bed showing no weakness. Sensory: Pinprick and light touch intact throughout, bilaterally Deep Tendon Reflexes: 1+ and symmetric throughout Plantars: equivocal  bilaterally Cerebellar: Normal finger-to-nose,   Lab Results: No results found for this basename: cbc, bmp, coags, chol, tri, ldl, hga1c   Lipid Panel No results found for this basename: CHOL,TRIG,HDL,CHOLHDL,VLDL,LDLCALC in the last 72 hours  Studies/Results: No results found.  Medications:     Scheduled:   . alprazolam  4 mg Oral QAC breakfast  . asenapine  5 mg Sublingual BID  . desmopressin  0.2 mg Oral QHS  . dexamethasone  10 mg Intravenous Q24H  . divalproex  1,500 mg Oral QHS  . enoxaparin (LOVENOX) injection  70 mg Subcutaneous Q24H  . influenza   inactive virus vaccine  0.5 mL Intramuscular Tomorrow-1000  . levofloxacin  750 mg Oral Daily  . lisdexamfetamine  50 mg Oral Daily  . sertraline  25 mg Oral Daily  . vitamin A & D      . DISCONTD: ampicillin-sulbactam (UNASYN) IV  3 g Intravenous Q6H    Assessment/Plan:    Patient Active Hospital Problem List:  Altered Mental Status  Assessment: improved. Able to follow commands and take part in conversation.  Parents in the room state he is back to baseline. EEG shows NO epileptiform activity. Plan: Agree with continued treatment of infection. patient is currently back to his home dose of Depakote.  I have discussed with Dr. Roseanne Reno and he agrees with the above mentioned.    Neuro will S/O    Felicie Morn PA-C [facility] [phone]  09/25/2011, 8:22 AM

## 2011-09-25 NOTE — Progress Notes (Signed)
Subjective: Per family mentation close to baseline.  Did not need to be restrained as he was not agitated.  Lips continued to be swollen from where he bit his lips.    Objective: Vital signs in last 24 hours: Filed Vitals:   09/24/11 1331 09/24/11 2200 09/25/11 0605 09/25/11 0645  BP: 149/104 150/93 142/86 137/86  Pulse: 113 118 114 94  Temp: 98.6 F (37 C) 98 F (36.7 C) 97.1 F (36.2 C) 98.6 F (37 C)  TempSrc: Axillary Axillary Axillary Axillary  Resp: 18 17 18 18   Height:    6\' 2"  (1.88 m)  Weight:    138.1 kg (304 lb 7.3 oz)  SpO2: 98% 97% 96% 98%   Weight change:   Intake/Output Summary (Last 24 hours) at 09/25/11 1412 Last data filed at 09/24/11 1841  Gross per 24 hour  Intake    240 ml  Output      0 ml  Net    240 ml    Physical Exam: General: Awake, No acute distress. HEENT: EOMI, had trace amount of blood on his lips, lower lip swollen. Neck: Supple CV: S1 and S2 Lungs: Clear to ascultation bilaterally Abdomen: Soft, Nontender, Nondistended, +bowel sounds. Ext: Good pulses. Trace edema.  Lab Results:  [facility] 09/25/11 0455 09/24/11 0518  NA 131* 135  K 3.7 4.2  CL 97 98  CO2 24 21  GLUCOSE 101* 105*  BUN 22 17  CREATININE 1.05 0.99  CALCIUM 9.0 9.9  MG -- --  PHOS -- --    Basename 09/24/11 0518 09/22/11 2000  AST 19 27  ALT 22 30  ALKPHOS 49 56  BILITOT 0.5 0.4  PROT 7.7 8.2  ALBUMIN 3.9 4.3   No results found for this basename: LIPASE:2,AMYLASE:2 in the last 72 hours  Basename 09/25/11 0455 09/24/11 0518 09/22/11 2000  WBC 8.5 7.8 --  NEUTROABS -- -- 3.8  HGB 13.5 14.3 --  HCT 39.5 41.9 --  MCV 91.2 91.1 --  PLT 279 323 --   No results found for this basename: CKTOTAL:3,CKMB:3,CKMBINDEX:3,TROPONINI:3 in the last 72 hours No components found with this basename: POCBNP:3 No results found for this basename: DDIMER:2 in the last 72 hours No results found for this basename: HGBA1C:2 in the last 72 hours No results found for this  basename: CHOL:2,HDL:2,LDLCALC:2,TRIG:2,CHOLHDL:2,LDLDIRECT:2 in the last 72 hours No results found for this basename: TSH,T4TOTAL,FREET3,T3FREE,THYROIDAB in the last 72 hours No results found for this basename: VITAMINB12:2,FOLATE:2,FERRITIN:2,TIBC:2,IRON:2,RETICCTPCT:2 in the last 72 hours  Micro Results: Recent Results (from the past 240 hour(s))  CULTURE, BLOOD (ROUTINE X 2)     Status: Normal (Preliminary result)   Collection Time   09/22/11  8:25 PM      Component Value Range Status Comment   Specimen Description BLOOD LEFT AC   Final    Special Requests BOTTLES DRAWN AEROBIC AND ANAEROBIC 5 CC EACH   Final    Setup Time [id number]   Final    Culture     Final    Value:        BLOOD CULTURE RECEIVED NO GROWTH TO DATE CULTURE WILL BE HELD FOR 5 DAYS BEFORE ISSUING A FINAL NEGATIVE REPORT   Report Status PENDING   Incomplete   CULTURE, BLOOD (ROUTINE X 2)     Status: Normal (Preliminary result)   Collection Time   09/22/11  8:40 PM      Component Value Range Status Comment   Specimen Description BLOOD LEFT FOREARM  Final    Special Requests BOTTLES DRAWN AEROBIC AND ANAEROBIC 3 CC EACH   Final    Setup Time [id number]   Final    Culture     Final    Value:        BLOOD CULTURE RECEIVED NO GROWTH TO DATE CULTURE WILL BE HELD FOR 5 DAYS BEFORE ISSUING A FINAL NEGATIVE REPORT   Report Status PENDING   Incomplete   URINE CULTURE     Status: Normal   Collection Time   09/22/11  9:09 PM      Component Value Range Status Comment   Specimen Description URINE, CATHETERIZED   Final    Special Requests NONE   Final    Setup Time [id number]   Final    Colony Count NO GROWTH   Final    Culture NO GROWTH   Final    Report Status 09/23/2011 FINAL   Final     Studies/Results: No results found.  Medications: I have reviewed the patient's current medications. Scheduled Meds:    . alprazolam  4 mg Oral QAC breakfast  . asenapine  5 mg Sublingual BID  . desmopressin  0.2 mg Oral QHS    . dexamethasone  10 mg Intravenous Q24H  . divalproex  1,500 mg Oral QHS  . enoxaparin (LOVENOX) injection  70 mg Subcutaneous Q24H  . levofloxacin  750 mg Oral Daily  . lisdexamfetamine  50 mg Oral Daily  . sertraline  25 mg Oral Daily  . vitamin A & D       Continuous Infusions:  PRN Meds:.acetaminophen, acetaminophen, LORazepam, ondansetron (ZOFRAN) IV, ondansetron, Racepinephrine HCl, zolpidem  Assessment/Plan: 1. Acute laryngitis Patient was evaluated by Dr. Jenne Pane, ENT, and patient had direct laryngoscopy which not show any clear etiology/pathology.  Discontinue Unasyn and start the patient on levofloxacin.  Antibiotics since 09/23/2011.  2.  Acute delirium on top of history of mental retardation.  Patient was noted to be lethargic with excessive drooling on admission.  EEG was done and did not show any epileptiform activity.  CT of the head was negative.  Dr. Thad Ranger, neurology evaluated the patient.  Given improvement in mentation, and per mother is close to baseline, no further workup is needed at this time.  3. Swollen lower lip.  Due to trauma from biting his own lip when the patient was agitated and confused.  4.  Elevated Depakote levels.  Resolved this morning.  Continue Depakote.  5.  History of mental retardation with behavioral disturbance.  Resume home medications.  6.  History of ADHD.  On home medications.  7.  Disposition.  Discharge the patient home with home health PT.    LOS: 3 days  Elzia Hott A, MD 09/25/2011, 2:12 PM

## 2011-09-25 NOTE — Progress Notes (Signed)
Physical Therapy Evaluation Patient Details Name: Chad Middleton MRN: [id number] DOB: 1986-08-04 Today's Date: 09/25/2011 11:45-12:15 EVII Recommend wide RW and HHPT for home  Problem List:  Patient Active Problem List  Diagnoses  . ADHD  . MODERATE MENTAL RETARDATION  . OBSTRUCTIVE SLEEP APNEA  . Acute laryngitis  . Acute delirium    Past Medical History:  Past Medical History  Diagnosis Date  . Mental retardation   . OSA (obstructive sleep apnea)   . ADHD (attention deficit hyperactivity disorder)   . Anxiety   . Incontinent of urine    Past Surgical History:  Past Surgical History  Procedure Date  . No past surgeries     PT Assessment/Plan/Recommendation PT Assessment Clinical Impression Statement: pt with deconditioning, bothered by swollen lip and drooling.  Anticipate he will improve with continued activity and be able to return to home with family.  He would benefir from temporary use of wide RW and HHPT PT Recommendation/Assessment: Patient will need skilled PT in the acute care venue PT Problem List: Decreased activity tolerance;Decreased safety awareness;Decreased balance Barriers to Discharge: None PT Therapy Diagnosis : Difficulty walking;Generalized weakness PT Plan PT Treatment/Interventions: DME instruction;Gait training;Functional mobility training;Therapeutic activities PT Recommendation Recommendations for Other Services: Other (comment) (none) Follow Up Recommendations: Home health PT Equipment Recommended: Rolling walker with 5" wheels (wide) PT Goals  Acute Rehab PT Goals PT Goal Formulation: With patient/family Pt will go Supine/Side to Sit: Independently PT Goal: Supine/Side to Sit - Progress: Not met Pt will go Sit to Stand: Independently PT Goal: Sit to Stand - Progress: Not met Pt will Ambulate: >150 feet;with modified independence PT Goal: Ambulate - Progress: Not met  PT Evaluation Precautions/Restrictions    Prior Functioning    Home Living Lives With: Family Receives Help From: Family Type of Home: House Home Layout: One level Bathroom Toilet: Handicapped height Home Adaptive Equipment: None Prior Function Level of Independence: Independent with gait Comments: mother stays with pt 24/7 Cognition Cognition Arousal/Alertness: Awake/alert Overall Cognitive Status: History of cognitive impairments Cognition - Other Comments: pt responds to dad's verbal commands, pt with decreased verbalizations, but has a large swollen lower lip with lesion on lip from where he bit it during an uncomfortable procedure. pt has uncontrolled drooling from lip.  pt may be expressing discomfort from this swelling Sensation/Coordination Sensation Light Touch: Not tested Stereognosis: Not tested Hot/Cold: Not tested Proprioception: Not tested Coordination Gross Motor Movements are Fluid and Coordinated: Not tested Fine Motor Movements are Fluid and Coordinated: Not tested Extremity Assessment RLE Assessment RLE Assessment: Within Functional Limits LLE Assessment LLE Assessment: Within Functional Limits Mobility (including Balance) Bed Mobility Supine to Sit: 5: Supervision Supine to Sit Details (indicate cue type and reason): pt moves slowly, but is able to follow dad's request to come sit on the edge of the bed Sitting - Scoot to Edge of Bed: 5: Supervision Transfers Sit to Stand: 5: Supervision Stand to Sit: 5: Supervision Stand to Sit Details: needs close supervision for safety as patient does not reach back for chair Ambulation/Gait Ambulation/Gait Assistance: 4: Min assist Ambulation/Gait Assistance Details (indicate cue type and reason): needs assist for direction and to step up into walker  pt defiitely uses RW for balance assist Ambulation Distance (Feet): 150 Feet Assistive device: Rolling walker (wide) Gait Pattern: Step-through pattern (excessive lateral weight shift at times) Gait velocity: slow Stairs:  No Wheelchair Mobility Wheelchair Mobility: No  Posture/Postural Control Posture/Postural Control: Postural limitations Postural Limitations: pt obese with large trunk,  he had some deconditioning from bedrest Balance Balance Assessed: No Exercise    End of Session PT - End of Session Equipment Utilized During Treatment:  (wide rolling walker) Activity Tolerance: Patient limited by fatigue (pt had 2 walks with sitting rest break, fatigue evident) Patient left: in chair Nurse Communication: Mobility status for transfers;Mobility status for ambulation General Behavior During Session: [facility] for tasks performed (pt followed one step commands)  Donnetta Hail 09/25/2011, 1:58 PM

## 2011-09-25 NOTE — Progress Notes (Signed)
Patient discharged home, all discharge medications and instructions reviewed with parents and questions answered. Patient assisted to vehicle by wheelchair.

## 2011-09-25 NOTE — Progress Notes (Signed)
Spoke with Physical Therapist who spoke with pt's parents.  It appears pt is at or close to baseline and his parents will be with him 24/7.  Will defer OT order at this time due to being close to baseline. Please reorder if status changes.   Tory Emerald, [place]  [phone]

## 2011-09-25 NOTE — Progress Notes (Signed)
Pt stable at time of transport. Pt transferred with belongings, family with pt.

## 2012-06-28 IMAGING — CT CT NECK W/ CM
2 of 3 series · 8 of 14 positions shown, 9 images · IV contrast (APPLIED)
Comparison: 09/22/2011.

CLINICAL DATA: Improving.  Possible infection.

CT NECK WITH CONTRAST
TECHNIQUE: Multidetector CT imaging of the neck was performed with
intravenous contrast.
Contrast: 100mL OMNIPAQUE IOHEXOL 300 MG/ML IV SOLN

[Series 2: neck with st · axial · 0.53mm/px · z∈[+670,+864]mm · 4 of 163 slices shown, 5 images]
[im 33/163  soft-tissue]
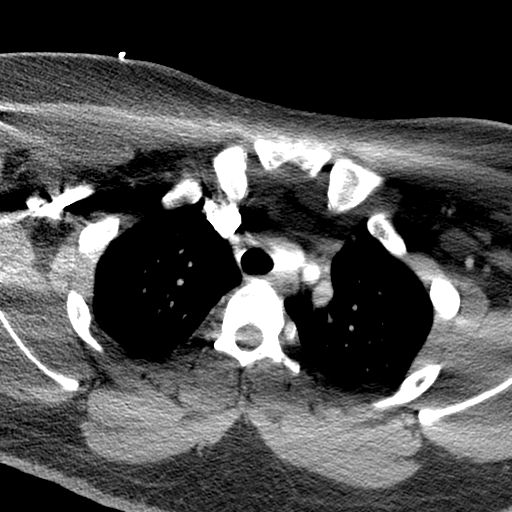
[im 33/163  bone]
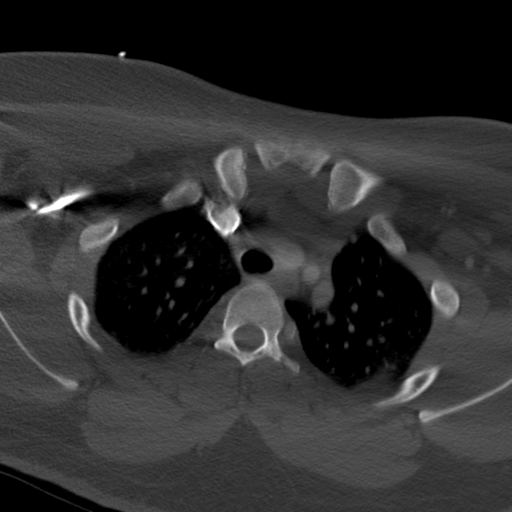
[im 65/163  bone]
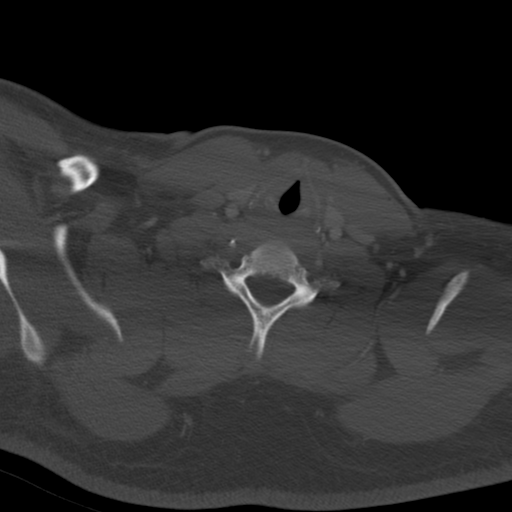
[im 98/163  bone]
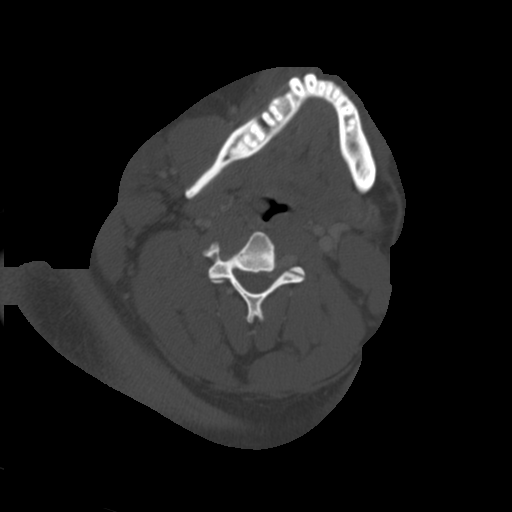
[im 130/163  bone]
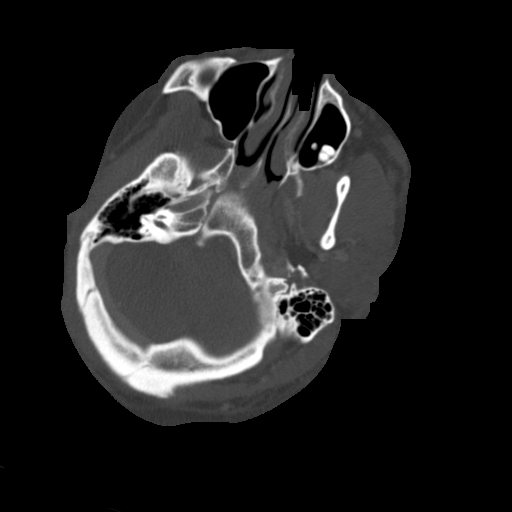

[Series 7: axial recons · axial · 0.39mm/px · z∈[+659,+850]mm · 4 of 163 slices shown]
[im 33/163  bone]
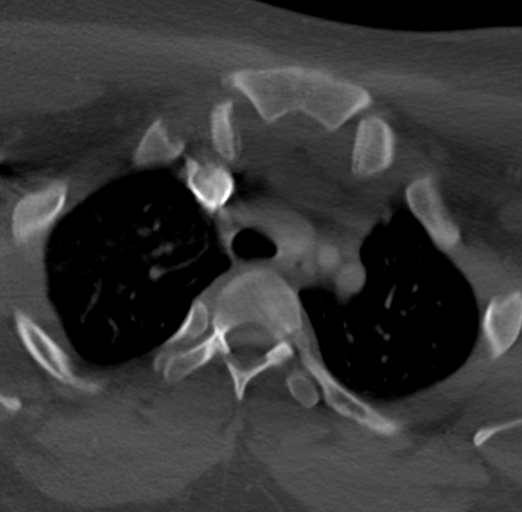
[im 65/163  bone]
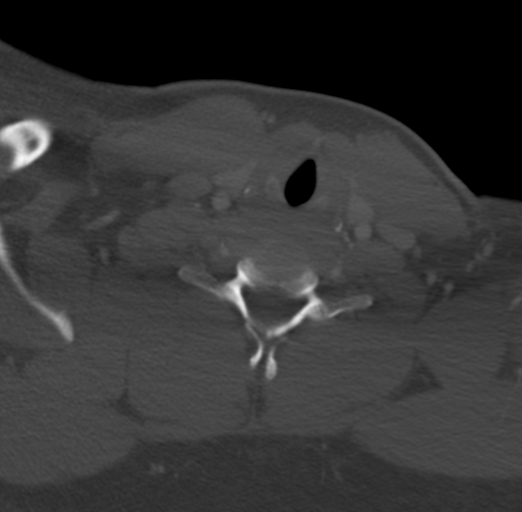
[im 98/163  bone]
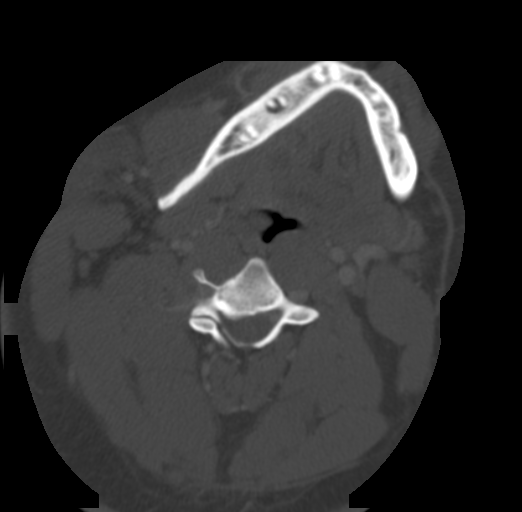
[im 130/163  bone]
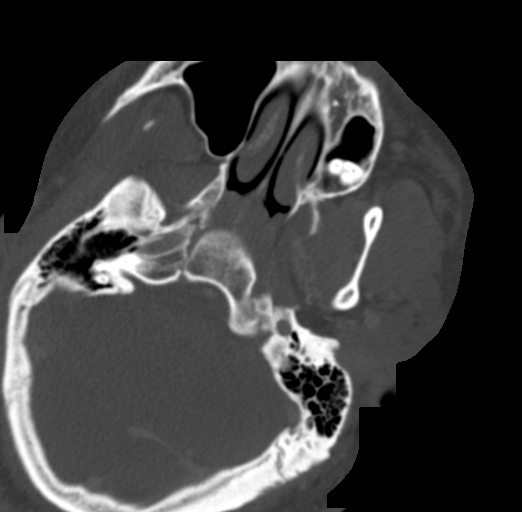

[8 of 14 positions shown; findings below may reference images not displayed]

FINDINGS: Grossly, visualized intracranial contents appear normal.
Globes and orbits grossly appear normal.  Peri apical lucencies are
present around the roots of the first molar roots bilaterally in
the mandibles, suggesting odontogenic infection.

There is no abscess identified.  Diffuse adenoidal and tonsillar
hypertrophy is present.  There is no abscess identified.  Soft
tissue swelling is present along the trachea with narrowing of the
posterior nasopharynx due to adenoidal hypertrophy and hypertrophy
of the soft palate.  The upper airway narrowing is also present
associated with tonsillar hypertrophy.  This extends to the
glottis.  Visualized lung apices appear normal.  Infraglottic
trachea appears normal.  Parotid glands grossly appear normal.
IMPRESSION: 1.  Significant narrowing of the nasal pharyngeal and
hypopharyngeal airway associated with tonsillar hypertrophy.  There
is probably mucosal edema extending to the level of the glottis.
No complete obstruction of the airway is identified.
2.  Negative for soft tissue abscess.
3.  Peri apical lucencies around the first mandibular molars
bilaterally suggesting chronic odontogenic infection.  Consider
follow-up dental consultation.
4.  Based on the exuberant tonsillar hypertrophy, question
infectious mononucleosis.

## 2014-10-19 ENCOUNTER — Emergency Department (HOSPITAL_COMMUNITY)
Admission: EM | Admit: 2014-10-19 | Discharge: 2014-10-19 | Disposition: A | Discharge status: Home / Self Care | Payer: Medicaid Other | Attending: Emergency Medicine | Admitting: Emergency Medicine

## 2014-10-19 ENCOUNTER — Encounter (HOSPITAL_COMMUNITY): Payer: Self-pay | Admitting: *Deleted

## 2014-10-19 DIAGNOSIS — R630 Anorexia: Secondary | ICD-10-CM | POA: Insufficient documentation

## 2014-10-19 DIAGNOSIS — R109 Unspecified abdominal pain: Secondary | ICD-10-CM | POA: Insufficient documentation

## 2014-10-19 DIAGNOSIS — Z79899 Other long term (current) drug therapy: Secondary | ICD-10-CM | POA: Insufficient documentation

## 2014-10-19 DIAGNOSIS — F79 Unspecified intellectual disabilities: Secondary | ICD-10-CM | POA: Insufficient documentation

## 2014-10-19 DIAGNOSIS — R634 Abnormal weight loss: Secondary | ICD-10-CM | POA: Diagnosis not present

## 2014-10-19 DIAGNOSIS — F909 Attention-deficit hyperactivity disorder, unspecified type: Secondary | ICD-10-CM | POA: Diagnosis not present

## 2014-10-19 DIAGNOSIS — C419 Malignant neoplasm of bone and articular cartilage, unspecified: Secondary | ICD-10-CM | POA: Insufficient documentation

## 2014-10-19 DIAGNOSIS — Z8669 Personal history of other diseases of the nervous system and sense organs: Secondary | ICD-10-CM | POA: Diagnosis not present

## 2014-10-19 DIAGNOSIS — K921 Melena: Secondary | ICD-10-CM | POA: Diagnosis present

## 2014-10-19 LAB — CBC WITH DIFFERENTIAL/PLATELET
BASOS ABS: 0 10*3/uL (ref 0.0–0.1)
Basophils Relative: 0 % (ref 0–1)
EOS ABS: 0.1 10*3/uL (ref 0.0–0.7)
EOS PCT: 1 % (ref 0–5)
HEMATOCRIT: 44.3 % (ref 39.0–52.0)
Hemoglobin: 14.7 g/dL (ref 13.0–17.0)
LYMPHS ABS: 2.4 10*3/uL (ref 0.7–4.0)
LYMPHS PCT: 50 % — AB (ref 12–46)
MCH: 30.9 pg (ref 26.0–34.0)
MCHC: 33.2 g/dL (ref 30.0–36.0)
MCV: 93.3 fL (ref 78.0–100.0)
MONO ABS: 0.3 10*3/uL (ref 0.1–1.0)
MONOS PCT: 6 % (ref 3–12)
Neutro Abs: 2.1 10*3/uL (ref 1.7–7.7)
Neutrophils Relative %: 43 % (ref 43–77)
Platelets: 250 10*3/uL (ref 150–400)
RBC: 4.75 MIL/uL (ref 4.22–5.81)
RDW: 12.6 % (ref 11.5–15.5)
WBC: 4.8 10*3/uL (ref 4.0–10.5)

## 2014-10-19 LAB — COMPREHENSIVE METABOLIC PANEL
ALK PHOS: 42 U/L (ref 39–117)
ALT: 26 U/L (ref 0–53)
ANION GAP: 6 (ref 5–15)
AST: 24 U/L (ref 0–37)
Albumin: 4.5 g/dL (ref 3.5–5.2)
BUN: 15 mg/dL (ref 6–23)
CO2: 30 mmol/L (ref 19–32)
CREATININE: 1.1 mg/dL (ref 0.50–1.35)
Calcium: 9.6 mg/dL (ref 8.4–10.5)
Chloride: 100 mmol/L (ref 96–112)
GFR calc non Af Amer: >90 mL/min (ref >90)
Glucose, Bld: 83 mg/dL (ref 70–99)
Potassium: 4.2 mmol/L (ref 3.5–5.1)
SODIUM: 136 mmol/L (ref 135–145)
Total Bilirubin: 0.7 mg/dL (ref 0.3–1.2)
Total Protein: 7.4 g/dL (ref 6.0–8.3)

## 2014-10-19 LAB — POC OCCULT BLOOD, ED: Fecal Occult Bld: NEGATIVE

## 2014-10-19 NOTE — Discharge Instructions (Signed)
Abdominal Pain Call Mr Chad Middleton's primary care physician's office today. They will refer him to a gastroenterologist. Return if concern for any reason Many things can cause abdominal pain. Usually, abdominal pain is not caused by a disease and will improve without treatment. It can often be observed and treated at home. Your health care provider will do a physical exam and possibly order blood tests and X-rays to help determine the seriousness of your pain. However, in many cases, more time must pass before a clear cause of the pain can be found. Before that point, your health care provider may not know if you need more testing or further treatment. HOME CARE INSTRUCTIONS  Monitor your abdominal pain for any changes. The following actions may help to alleviate any discomfort you are experiencing:  Only take over-the-counter or prescription medicines as directed by your health care provider.  Do not take laxatives unless directed to do so by your health care provider.  Try a clear liquid diet (broth, tea, or water) as directed by your health care provider. Slowly move to a bland diet as tolerated. SEEK MEDICAL CARE IF:  You have unexplained abdominal pain.  You have abdominal pain associated with nausea or diarrhea.  You have pain when you urinate or have a bowel movement.  You experience abdominal pain that wakes you in the night.  You have abdominal pain that is worsened or improved by eating food.  You have abdominal pain that is worsened with eating fatty foods.  You have a fever. SEEK IMMEDIATE MEDICAL CARE IF:   Your pain does not go away within 2 hours.  You keep throwing up (vomiting).  Your pain is felt only in portions of the abdomen, such as the right side or the left lower portion of the abdomen.  You pass bloody or black tarry stools. MAKE SURE YOU:  Understand these instructions.   Will watch your condition.   Will get help right away if you are not doing well or  get worse.  Document Released: 06/10/2005 Document Revised: 09/05/2013 Document Reviewed: 05/10/2013 [facility] Patient Information 2015 [place], [place]. This information is not intended to replace advice given to you by your health care provider. Make sure you discuss any questions you have with your health care provider.

## 2014-10-19 NOTE — ED Notes (Signed)
Per Nonda Lou, NP, patient was seen in her office r/t abdominal pain for 3 months-states he has been treated for H-pylori-still having abdominal pain, not eating, has lost weight-upon her exam, patient appeared sedated and would only respond to her palpating abdomin, painful stimuli-states she is unable to perform diagnostics due to possible noncompliance and mother stating she thought he would possibly act out, patient mentally handicapped-unable to due scans-preferred patient come to ED to be evaluated and patient will follow up with her next week

## 2014-10-19 NOTE — ED Notes (Signed)
PT came into the ED today with his mother Britt Boozer) whom reports pt has been experiencing loose, black stools since about Tuesday. She denied changes in diet and says he has not been eating as much lately. Pt seen by PCP today whom felt pt should come to ED.

## 2014-10-19 NOTE — ED Provider Notes (Signed)
CSN: [id number]     Arrival date & time 10/19/14  1009 History   First MD Initiated Contact with Patient 10/19/14 1025     Chief Complaint  Patient presents with  . Melena     (Consider location/radiation/quality/duration/timing/severity/associated sxs/prior Treatment) HPI Level V caveat patient mentally retarded. History is obtained from patient's mother, from records and from Geanie Berlin, NP via telephone.  Patient complains of abdominal pain for the past several days, becoming worse last night. Last night he was crying per his mother. Mother also reports that he's had black stools for 3 days. She did treat him with Pepto-Bismol earlier this week. He's had no vomiting. He did not eat yesterday but presently states he is hungry. No fever. No other associated symptoms. He had been treated with his usual medications prior to coming here this morning. When Ms. Anderson evaluated him in her office this morning patient was somnolent and she felt that she could not get a good exam. He was sent here for further evaluation. Presently patient is pain-free and states that he is hungry. His Ouida Sills reports 24 pound weight loss over the past 3 months. No other associated symptoms Past Medical History  Diagnosis Date  . Mental retardation   . OSA (obstructive sleep apnea)   . ADHD (attention deficit hyperactivity disorder)   . Anxiety   . Incontinent of urine    Past Surgical History  Procedure Laterality Date  . No past surgeries     No family history on file. History  Substance Use Topics  . Smoking status: Never Smoker   . Smokeless tobacco: Never Used  . Alcohol Use: No   mentally retarded  Review of Systems  Unable to perform ROS Constitutional: Positive for appetite change and unexpected weight change.  Gastrointestinal: Positive for abdominal pain.      Allergies  Review of patient's allergies indicates no known allergies.  Home Medications   Prior to Admission  medications   Medication Sig Start Date End Date Taking? Authorizing Provider  alprazolam Duanne Moron) 2 MG tablet Take 2 tablets by mouth every morning     Historical Provider, MD  asenapine (SAPHRIS) 5 MG SUBL Place 5 mg under the tongue 2 (two) times daily.      Historical Provider, MD  desmopressin (DDAVP) 0.2 MG tablet Take 0.2 mg by mouth at bedtime.      Historical Provider, MD  Dextromethorphan-Guaifenesin (MUCUS RELIEF DM) 20-400 MG TABS Take 1 tablet by mouth every 4 (four) hours as needed.      Historical Provider, MD  divalproex (DEPAKOTE ER) 500 MG 24 hr tablet 500 mg. Take 3 tablets by mouth at bedtime    Historical Provider, MD  lisdexamfetamine (VYVANSE) 50 MG capsule Take 50 mg by mouth every morning.      Historical Provider, MD  loratadine (CLARITIN) 10 MG tablet Take 10 mg by mouth daily.      Historical Provider, MD  protriptyline (VIVACTIL) 5 MG tablet TAKE 1 TABLET AT BEDTIME FOR SNORING 08/11/11   Deneise Lever, MD  sertraline (ZOLOFT) 25 MG tablet Take 25 mg by mouth daily.      Historical Provider, MD   BP 124/72 mmHg  Pulse 83  Temp(Src) 97.6 F (36.4 C) (Axillary)  Resp 18  Ht 6' (1.829 m)  Wt 290 lb (131.543 kg)  BMI 39.32 kg/m2  SpO2 100% Physical Exam  Constitutional: He appears well-developed and well-nourished.  HENT:  Head: Normocephalic and atraumatic.  Eyes: Conjunctivae  are normal. Pupils are equal, round, and reactive to light.  Neck: Neck supple. No tracheal deviation present. No thyromegaly present.  Cardiovascular: Normal rate and regular rhythm.   No murmur heard. Pulmonary/Chest: Effort normal and breath sounds normal.  Abdominal: Soft. Bowel sounds are normal. He exhibits no distension. There is no tenderness.  Genitourinary: Penis normal.  Normal male genitalia. Rectal normal tone brown stool no gross blood  Musculoskeletal: Normal range of motion. He exhibits no edema or tenderness.  Neurological: He is alert. Coordination normal.  Skin:  Skin is warm and dry. No rash noted.  Psychiatric: He has a normal mood and affect.  Nursing note and vitals reviewed.   ED Course  Procedures (including critical care time) Labs Review Labs Reviewed - No data to display  Imaging Review No results found.   EKG Interpretation None     12:10 PM patient is asymptomatic. States he is hungry.Marland Kitchen Appears comfortable and looks at baseline to his mother. Results for orders placed or performed during the hospital encounter of 10/19/14  Comprehensive metabolic panel  Result Value Ref Range   Sodium 136 135 - 145 mmol/L   Potassium 4.2 3.5 - 5.1 mmol/L   Chloride 100 96 - 112 mmol/L   CO2 30 19 - 32 mmol/L   Glucose, Bld 83 70 - 99 mg/dL   BUN 15 6 - 23 mg/dL   Creatinine, Ser 1.10 0.50 - 1.35 mg/dL   Calcium 9.6 8.4 - 10.5 mg/dL   Total Protein 7.4 6.0 - 8.3 g/dL   Albumin 4.5 3.5 - 5.2 g/dL   AST 24 0 - 37 U/L   ALT 26 0 - 53 U/L   Alkaline Phosphatase 42 39 - 117 U/L   Total Bilirubin 0.7 0.3 - 1.2 mg/dL   GFR calc non Af Amer >90 >90 mL/min   GFR calc Af Amer >90 >90 mL/min   Anion gap 6 5 - 15  CBC with Differential/Platelet  Result Value Ref Range   WBC 4.8 4.0 - 10.5 K/uL   RBC 4.75 4.22 - 5.81 MIL/uL   Hemoglobin 14.7 13.0 - 17.0 g/dL   HCT 44.3 39.0 - 52.0 %   MCV 93.3 78.0 - 100.0 fL   MCH 30.9 26.0 - 34.0 pg   MCHC 33.2 30.0 - 36.0 g/dL   RDW 12.6 11.5 - 15.5 %   Platelets 250 150 - 400 K/uL   Neutrophils Relative % 43 43 - 77 %   Neutro Abs 2.1 1.7 - 7.7 K/uL   Lymphocytes Relative 50 (H) 12 - 46 %   Lymphs Abs 2.4 0.7 - 4.0 K/uL   Monocytes Relative 6 3 - 12 %   Monocytes Absolute 0.3 0.1 - 1.0 K/uL   Eosinophils Relative 1 0 - 5 %   Eosinophils Absolute 0.1 0.0 - 0.7 K/uL   Basophils Relative 0 0 - 1 %   Basophils Absolute 0.0 0.0 - 0.1 K/uL  POC occult blood, ED Provider will collect  Result Value Ref Range   Fecal Occult Bld NEGATIVE NEGATIVE   No results found.  MDM  Plan :Ms. Ouida Sills will refer  patient to gastroenterologist Final diagnoses:  None   diagnosis abdominal pain      Orlie Dakin, MD 10/19/14 1217

## 2014-11-16 ENCOUNTER — Encounter (HOSPITAL_COMMUNITY): Payer: Self-pay | Admitting: *Deleted

## 2014-11-26 ENCOUNTER — Other Ambulatory Visit: Payer: Self-pay | Admitting: Gastroenterology

## 2014-11-27 ENCOUNTER — Ambulatory Visit (HOSPITAL_COMMUNITY): Admission: RE | Admit: 2014-11-27 | Payer: Medicaid Other | Source: Ambulatory Visit | Admitting: Gastroenterology

## 2014-11-27 SURGERY — ESOPHAGOGASTRODUODENOSCOPY (EGD) WITH PROPOFOL
Anesthesia: Monitor Anesthesia Care

## 2014-12-10 ENCOUNTER — Encounter (HOSPITAL_COMMUNITY): Payer: Self-pay | Admitting: *Deleted

## 2014-12-19 ENCOUNTER — Other Ambulatory Visit: Payer: Self-pay | Admitting: Gastroenterology

## 2014-12-20 ENCOUNTER — Encounter (HOSPITAL_COMMUNITY): Payer: Self-pay | Admitting: *Deleted

## 2014-12-20 ENCOUNTER — Ambulatory Visit (HOSPITAL_COMMUNITY): Payer: Medicaid Other | Admitting: Anesthesiology

## 2014-12-20 ENCOUNTER — Ambulatory Visit (HOSPITAL_COMMUNITY)
Admission: RE | Admit: 2014-12-20 | Discharge: 2014-12-20 | Disposition: A | Discharge status: Home / Self Care | Payer: Medicaid Other | Source: Ambulatory Visit | Attending: Gastroenterology | Admitting: Gastroenterology

## 2014-12-20 ENCOUNTER — Encounter (HOSPITAL_COMMUNITY)
Admission: RE | Disposition: A | Discharge status: Home / Self Care | Payer: Self-pay | Source: Ambulatory Visit | Attending: Gastroenterology

## 2014-12-20 DIAGNOSIS — K449 Diaphragmatic hernia without obstruction or gangrene: Secondary | ICD-10-CM | POA: Diagnosis not present

## 2014-12-20 DIAGNOSIS — K921 Melena: Secondary | ICD-10-CM | POA: Diagnosis present

## 2014-12-20 DIAGNOSIS — F909 Attention-deficit hyperactivity disorder, unspecified type: Secondary | ICD-10-CM | POA: Diagnosis not present

## 2014-12-20 DIAGNOSIS — G4733 Obstructive sleep apnea (adult) (pediatric): Secondary | ICD-10-CM | POA: Diagnosis not present

## 2014-12-20 DIAGNOSIS — F329 Major depressive disorder, single episode, unspecified: Secondary | ICD-10-CM | POA: Insufficient documentation

## 2014-12-20 DIAGNOSIS — F79 Unspecified intellectual disabilities: Secondary | ICD-10-CM | POA: Insufficient documentation

## 2014-12-20 DIAGNOSIS — I10 Essential (primary) hypertension: Secondary | ICD-10-CM | POA: Diagnosis not present

## 2014-12-20 HISTORY — PX: ESOPHAGOGASTRODUODENOSCOPY (EGD) WITH PROPOFOL: SHX5813

## 2014-12-20 HISTORY — DX: Essential (primary) hypertension: I10

## 2014-12-20 SURGERY — ESOPHAGOGASTRODUODENOSCOPY (EGD) WITH PROPOFOL
Anesthesia: Monitor Anesthesia Care

## 2014-12-20 MED ORDER — PROPOFOL 10 MG/ML IV BOLUS
INTRAVENOUS | Status: AC
  Filled 2014-12-20: qty 20

## 2014-12-20 MED ORDER — LACTATED RINGERS IV SOLN
INTRAVENOUS | Status: DC
  Administered 2014-12-20: 1000 mL via INTRAVENOUS

## 2014-12-20 MED ORDER — PROPOFOL 10 MG/ML IV BOLUS
INTRAVENOUS | Status: DC | PRN
  Administered 2014-12-20: 50 mg via INTRAVENOUS
  Administered 2014-12-20: 100 mg via INTRAVENOUS
  Administered 2014-12-20: 50 mg via INTRAVENOUS

## 2014-12-20 SURGICAL SUPPLY — 14 items

## 2014-12-20 NOTE — Addendum Note (Signed)
Addended by: Terrez Ander on: 12/20/2014 08:35 AM   Modules accepted: Orders  

## 2014-12-20 NOTE — Addendum Note (Signed)
Addended by: Aquarius Latouche on: 12/20/2014 08:35 AM   Modules accepted: Orders  

## 2014-12-20 NOTE — Anesthesia Postprocedure Evaluation (Signed)
  Anesthesia Post-op Note  Patient: Chad Middleton  Procedure(s) Performed: Procedure(s) (LRB): ESOPHAGOGASTRODUODENOSCOPY (EGD) WITH PROPOFOL (N/A)  Patient Location: PACU  Anesthesia Type: MAC  Level of Consciousness: awake and alert   Airway and Oxygen Therapy: Patient Spontanous Breathing  Post-op Pain: mild  Post-op Assessment: Post-op Vital signs reviewed, Patient's Cardiovascular Status Stable, Respiratory Function Stable, Patent Airway and No signs of Nausea or vomiting  Last Vitals:  Filed Vitals:   12/20/14 0941  BP: 129/77  Pulse:   Temp: 36.8 C  Resp: 20    Post-op Vital Signs: stable   Complications: No apparent anesthesia complications

## 2014-12-20 NOTE — Op Note (Signed)
[facility] [place], [address]   ENDOSCOPY PROCEDURE REPORT  PATIENT: Chad Middleton, Chad Middleton  MR#: [id number] BIRTHDATE: 11-Aug-1986 , 28  yrs. old GENDER: male ENDOSCOPIST: Wilford Corner, MD REFERRED BY:  Vicenta Aly, F.N.P.-B.C. PROCEDURE DATE:  Jan 04, 2015 PROCEDURE:  EGD, diagnostic ASA CLASS:     Class III INDICATIONS:  melena. MEDICATIONS: Monitored anesthesia care and Per Anesthesia TOPICAL ANESTHETIC:  DESCRIPTION OF PROCEDURE: After the risks benefits and alternatives of the procedure were thoroughly explained, informed consent was obtained.  The Pentax Gastroscope [id number] endoscope was introduced through the mouth and advanced to the second portion of the duodenum , Without limitations.  The instrument was slowly withdrawn as the mucosa was fully examined. Estimated blood loss is zero unless otherwise noted in this procedure report.    Esophagus normal in appearance and GEJ 42 cm from the incisors. Stomach mucosa normal in appearance. Medium-sized hiatal hernia noted on retroflexion. Duodenum and 2nd portion of the duodenum normal in appearance.              The scope was then withdrawn from the patient and the procedure completed.  COMPLICATIONS: There were no immediate complications.  ENDOSCOPIC IMPRESSION:     Medium-sized hiatal hernia otherwise normal EGD No blood products noted  RECOMMENDATIONS:     Avoid large portion meals and avoid late-night eating. Lifestyle modification for GERD   eSigned:  Wilford Corner, MD 01/04/15 9:43 AM    CC:  CPT CODES: ICD CODES:  The ICD and CPT codes recommended by this software are interpretations from the data that the clinical staff has captured with the software.  The verification of the translation of this report to the ICD and CPT codes and modifiers is the sole responsibility of the health care institution and practicing physician where this report was generated.  [employer] will not be held responsible for the validity of the ICD and CPT codes included on this report.  AMA assumes no liability for data contained or not contained herein. CPT is a Designer, television/film set of the [employer].  PATIENT NAME:  Trevis, Eden MR#: [id number]

## 2014-12-20 NOTE — Transfer of Care (Signed)
Immediate Anesthesia Transfer of Care Note  Patient: Chad Middleton  Procedure(s) Performed: Procedure(s): ESOPHAGOGASTRODUODENOSCOPY (EGD) WITH PROPOFOL (N/A)  Patient Location: PACU and Endoscopy Unit  Anesthesia Type:MAC  Level of Consciousness: awake, sedated and patient cooperative  Airway & Oxygen Therapy: Patient Spontanous Breathing and Patient connected to nasal cannula oxygen  Post-op Assessment: Report given to RN and Post -op Vital signs reviewed and stable  Post vital signs: Reviewed and stable  Last Vitals:  Filed Vitals:   12/20/14 0842  BP: 137/78  Pulse: 91  Temp: 36.7 C  Resp: 14    Complications: No apparent anesthesia complications

## 2014-12-20 NOTE — Anesthesia Preprocedure Evaluation (Addendum)
Anesthesia Evaluation  Patient identified by MRN, date of birth, ID band Patient awake    Reviewed: Allergy & Precautions, H&P , NPO status , Patient's Chart, lab work & pertinent test results  Airway Mallampati: III  TM Distance: >3 FB Neck ROM: full    Dental no notable dental hx. (+) Teeth Intact, Dental Advisory Given   Pulmonary sleep apnea ,  breath sounds clear to auscultation  Pulmonary exam normal       Cardiovascular Exercise Tolerance: Good hypertension, Pt. on medications negative cardio ROS  Rhythm:regular Rate:Normal     Neuro/Psych Mental retardation negative psych ROS   GI/Hepatic negative GI ROS, Neg liver ROS,   Endo/Other  negative endocrine ROS  Renal/GU negative Renal ROS  negative genitourinary   Musculoskeletal   Abdominal   Peds  Hematology negative hematology ROS (+)   Anesthesia Other Findings   Reproductive/Obstetrics negative OB ROS                            Anesthesia Physical Anesthesia Plan  ASA: III  Anesthesia Plan: MAC   Post-op Pain Management:    Induction:   Airway Management Planned:   Additional Equipment:   Intra-op Plan:   Post-operative Plan:   Informed Consent: I have reviewed the patients History and Physical, chart, labs and discussed the procedure including the risks, benefits and alternatives for the proposed anesthesia with the patient or authorized representative who has indicated his/her understanding and acceptance.   Dental Advisory Given  Plan Discussed with: CRNA and Surgeon  Anesthesia Plan Comments:         Anesthesia Quick Evaluation

## 2014-12-20 NOTE — H&P (Signed)
  Date of Initial H&P: 12/11/14  History reviewed, patient examined, no change in status, stable for surgery.

## 2014-12-20 NOTE — Interval H&P Note (Signed)
History and Physical Interval Note:  12/20/2014 9:12 AM  Chad Middleton  has presented today for surgery, with the diagnosis of melena   The various methods of treatment have been discussed with the patient and family. After consideration of risks, benefits and other options for treatment, the patient has consented to  Procedure(s): ESOPHAGOGASTRODUODENOSCOPY (EGD) WITH PROPOFOL (N/A) as a surgical intervention .  The patient's history has been reviewed, patient examined, no change in status, stable for surgery.  I have reviewed the patient's chart and labs.  Questions were answered to the patient's satisfaction.     [place] C.

## 2014-12-20 NOTE — Discharge Instructions (Signed)
Esophagogastroduodenoscopy Care After Refer to this sheet in the next few weeks. These instructions provide you with information on caring for yourself after your procedure. Your caregiver may also give you more specific instructions. Your treatment has been planned according to current medical practices, but problems sometimes occur. Call your caregiver if you have any problems or questions after your procedure.  HOME CARE INSTRUCTIONS  Do not eat or drink anything until the numbing medicine (local anesthetic) has worn off and your gag reflex has returned. You will know that the local anesthetic has worn off when you can swallow comfortably.  Do not drive for 12 hours after the procedure or as directed by your caregiver.  Only take medicines as directed by your caregiver. SEEK MEDICAL CARE IF:   You cannot stop coughing.  You are not urinating at all or less than usual. SEEK IMMEDIATE MEDICAL CARE IF:  You have difficulty swallowing.  You cannot eat or drink.  You have worsening throat or chest pain.  You have dizziness, lightheadedness, or you faint.  You have nausea or vomiting.  You have chills.  You have a fever.  You have severe abdominal pain.  You have black, tarry, or bloody stools. Document Released: 08/17/2012 Document Reviewed: 08/17/2012 [facility] Patient Information 2015 [place]. This information is not intended to replace advice given to you by your health care provider. Make sure you discuss any questions you have with your health care provider.   Conscious Sedation, Adult, Care After Refer to this sheet in the next few weeks. These instructions provide you with information on caring for yourself after your procedure. Your health care provider may also give you more specific instructions. Your treatment has been planned according to current medical practices, but problems sometimes occur. Call your health care provider if you have any problems or  questions after your procedure. WHAT TO EXPECT AFTER THE PROCEDURE  After your procedure:  You may feel sleepy, clumsy, and have poor balance for several hours.  Vomiting may occur if you eat too soon after the procedure. HOME CARE INSTRUCTIONS  Do not participate in any activities where you could become injured for at least 24 hours. Do not:  Drive.  Swim.  Ride a bicycle.  Operate heavy machinery.  Cook.  Use power tools.  Climb ladders.  Work from a high place.  Do not make important decisions or sign legal documents until you are improved.  If you vomit, drink water, juice, or soup when you can drink without vomiting. Make sure you have little or no nausea before eating solid foods.  Only take over-the-counter or prescription medicines for pain, discomfort, or fever as directed by your health care provider.  Make sure you and your family fully understand everything about the medicines given to you, including what side effects may occur.  You should not drink alcohol, take sleeping pills, or take medicines that cause drowsiness for at least 24 hours.  If you smoke, do not smoke without supervision.  If you are feeling better, you may resume normal activities 24 hours after you were sedated.  Keep all appointments with your health care provider. SEEK MEDICAL CARE IF:  Your skin is pale or bluish in color.  You continue to feel nauseous or vomit.  Your pain is getting worse and is not helped by medicine.  You have bleeding or swelling.  You are still sleepy or feeling clumsy after 24 hours. SEEK IMMEDIATE MEDICAL CARE IF:  You develop a  rash.  You have difficulty breathing.  You develop any type of allergic problem.  You have a fever. MAKE SURE YOU:  Understand these instructions.  Will watch your condition.  Will get help right away if you are not doing well or get worse. Document Released: 06/21/2013 Document Reviewed: 06/21/2013 [facility]  Patient Information 2015 [place], [place]. This information is not intended to replace advice given to you by your health care provider. Make sure you discuss any questions you have with your health care provider.   Gastroesophageal Reflux Disease, Adult Gastroesophageal reflux disease (GERD) happens when acid from your stomach goes into your food pipe (esophagus). The acid can cause a burning feeling in your chest. Over time, the acid can make small holes (ulcers) in your food pipe.  HOME CARE  Ask your doctor for advice about:  Losing weight.  Quitting smoking.  Alcohol use.  Avoid foods and drinks that make your problems worse. You may want to avoid:  Caffeine and alcohol.  Chocolate.  Mints.  Garlic and onions.  Spicy foods.  Citrus fruits, such as oranges, lemons, or limes.  Foods that contain tomato, such as sauce, chili, salsa, and pizza.  Fried and fatty foods.  Avoid lying down for 3 hours before you go to bed or before you take a nap.  Eat small meals often, instead of large meals.  Wear loose-fitting clothing. Do not wear anything tight around your waist.  Raise (elevate) the head of your bed 6 to 8 inches with wood blocks. Using extra pillows does not help.  Only take medicines as told by your doctor.  Do not take aspirin or ibuprofen. GET HELP RIGHT AWAY IF:   You have pain in your arms, neck, jaw, teeth, or back.  Your pain gets worse or changes.  You feel sick to your stomach (nauseous), throw up (vomit), or sweat (diaphoresis).  You feel short of breath, or you pass out (faint).  Your throw up is green, yellow, black, or looks like coffee grounds or blood.  Your poop (stool) is red, bloody, or black. MAKE SURE YOU:   Understand these instructions.  Will watch your condition.  Will get help right away if you are not doing well or get worse. Document Released: 02/17/2008 Document Revised: 11/23/2011 Document Reviewed: 03/20/2011 [facility]  Patient Information 2015 [place], [place]. This information is not intended to replace advice given to you by your health care provider. Make sure you discuss any questions you have with your health care provider.   Hiatal Hernia A hiatal hernia occurs when part of your stomach slides above the muscle that separates your abdomen from your chest (diaphragm). You can be born with a hiatal hernia (congenital), or it may develop over time. In almost all cases of hiatal hernia, only the top part of the stomach pushes through.  Many people have a hiatal hernia with no symptoms. The larger the hernia, the more likely that you will have symptoms. In some cases, a hiatal hernia allows stomach acid to flow back into the tube that carries food from your mouth to your stomach (esophagus). This may cause heartburn symptoms. Severe heartburn symptoms may mean you have developed a condition called gastroesophageal reflux disease (GERD).  CAUSES  Hiatal hernias are caused by a weakness in the opening (hiatus) where your esophagus passes through your diaphragm to attach to the upper part of your stomach. You may be born with a weakness in your hiatus, or a weakness can develop. RISK  FACTORS Older age is a major risk factor for a hiatal hernia. Anything that increases pressure on your diaphragm can also increase your risk of a hiatal hernia. This includes:  Pregnancy.  Excess weight.  Frequent constipation. SIGNS AND SYMPTOMS  People with a hiatal hernia often have no symptoms. If symptoms develop, they are almost always caused by GERD. They may include:  Heartburn.  Belching.  Indigestion.  Trouble swallowing.  Coughing or wheezing.  Sore throat.  Hoarseness.  Chest pain. DIAGNOSIS  A hiatal hernia is sometimes found during an exam for another problem. Your health care provider may suspect a hiatal hernia if you have symptoms of GERD. Tests may be done to diagnose GERD. These may include:  X-rays  of your stomach or chest.  An upper gastrointestinal (GI) series. This is an X-ray exam of your GI tract involving the use of a chalky liquid that you swallow. The liquid shows up clearly on the X-ray.  Endoscopy. This is a procedure to look into your stomach using a thin, flexible tube that has a tiny camera and light on the end of it. TREATMENT  If you have no symptoms, you may not need treatment. If you have symptoms, treatment may include:  Dietary and lifestyle changes to help reduce GERD symptoms.  Medicines. These may include:  Over-the-counter antacids.  Medicines that make your stomach empty more quickly.  Medicines that block the production of stomach acid (H2 blockers).  Stronger medicines to reduce stomach acid (proton pump inhibitors).  You may need surgery to repair the hernia if other treatments are not helping. HOME CARE INSTRUCTIONS   Take all medicines as directed by your health care provider.  Quit smoking, if you smoke.  Try to achieve and maintain a healthy body weight.  Eat frequent small meals instead of three large meals a day. This keeps your stomach from getting too full.  Eat slowly.  Do not lie down right after eating.  Do noteat 1-2 hours before bed.   Do not drink beverages with caffeine. These include cola, coffee, cocoa, and tea.  Do not drink alcohol.  Avoid foods that can make symptoms of GERD worse. These may include:  Fatty foods.  Citrus fruits.  Other foods and drinks that contain acid.  Avoid putting pressure on your belly. Anything that puts pressure on your belly increases the amount of acid that may be pushed up into your esophagus.   Avoid bending over, especially after eating.  Raise the head of your bed by putting blocks under the legs. This keeps your head and esophagus higher than your stomach.  Do not wear tight clothing around your chest or stomach.  Try not to strain when having a bowel movement, when  urinating, or when lifting heavy objects. SEEK MEDICAL CARE IF:  Your symptoms are not controlled with medicines or lifestyle changes.  You are having trouble swallowing.  You have coughing or wheezing that will not go away. SEEK IMMEDIATE MEDICAL CARE IF:  Your pain is getting worse.  Your pain spreads to your arms, neck, jaw, teeth, or back.  You have shortness of breath.  You sweat for no reason.  You feel sick to your stomach (nauseous) or vomit.  You vomit blood.  You have bright red blood in your stools.  You have black, tarry stools.  Document Released: 11/21/2003 Document Revised: 01/15/2014 Document Reviewed: 08/18/2013 [facility] Patient Information 2015 [place], [place]. This information is not intended to replace advice given to  you by your health care provider. Make sure you discuss any questions you have with your health care provider.

## 2014-12-21 ENCOUNTER — Encounter (HOSPITAL_COMMUNITY): Payer: Self-pay | Admitting: Gastroenterology

## 2015-10-17 ENCOUNTER — Encounter: Payer: Self-pay | Admitting: Internal Medicine

## 2015-10-17 ENCOUNTER — Ambulatory Visit (INDEPENDENT_AMBULATORY_CARE_PROVIDER_SITE_OTHER): Payer: Medicaid Other | Admitting: Internal Medicine

## 2015-10-17 ENCOUNTER — Encounter (INDEPENDENT_AMBULATORY_CARE_PROVIDER_SITE_OTHER): Payer: Self-pay

## 2015-10-17 VITALS — BP 126/80 | HR 77 | Ht 74.0 in | Wt 312.0 lb

## 2015-10-17 DIAGNOSIS — Z23 Encounter for immunization: Secondary | ICD-10-CM

## 2015-10-17 DIAGNOSIS — F71 Moderate intellectual disabilities: Secondary | ICD-10-CM | POA: Diagnosis not present

## 2015-10-17 DIAGNOSIS — J309 Allergic rhinitis, unspecified: Secondary | ICD-10-CM | POA: Diagnosis not present

## 2015-10-17 DIAGNOSIS — G4733 Obstructive sleep apnea (adult) (pediatric): Secondary | ICD-10-CM

## 2015-10-17 DIAGNOSIS — J302 Other seasonal allergic rhinitis: Secondary | ICD-10-CM | POA: Insufficient documentation

## 2015-10-17 DIAGNOSIS — J3089 Other allergic rhinitis: Secondary | ICD-10-CM

## 2015-10-17 NOTE — Patient Instructions (Signed)
Order- schedule NPSG split protocol for dx OSA    Needs one-on-one tech and accomodation for family member to stay  Try otc Flonase/ fluticasone nasal spray   1-2 puffs each nostril once daily at bedtime      See if this gradually helps with stuffy nose

## 2015-10-17 NOTE — Assessment & Plan Note (Signed)
He does not seem able to understand his circumstances, discussion of medical issues and therapeutic options. From previous experience he would not leave equipment alone and would keep trying to pull it off.

## 2015-10-17 NOTE — Assessment & Plan Note (Signed)
He has gained significant weight since previous study. We will need to update documentation with a new sleep study. We discussed possible unattended home study but mother says there is no way he will not take and pull at home monitor leads. Best hope is an unattended sleep study where technician can step in to maintain monitoring leads, and a family member can stay with him to help. I don't know if he would tolerate an oral appliance. Surgical referral might be a consideration because of his special circumstances and unable to cooperate with therapeutic devices.

## 2015-10-17 NOTE — Assessment & Plan Note (Signed)
They have not been able to training to blow his nose but recognize persistent moderate nasal congestion. I do not see polyps. Plan-Flonase

## 2015-10-17 NOTE — Progress Notes (Signed)
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 Mother here. Last here- 08/26/2010. PCP Dr Jacklynn Ganong When we had last seen him, he had failed trials of CPAP with several masks, and home oxygen for sleep. The problem was that he would not leave them alone and would not leave them on long enough to be helpful at night. CPAP had been set at 11 CWP/Advanced. We had recorded overnight oximetry which showed minimal sleep-related desaturation. On that basis we decided he was better off left alone. Unfortunately since then he has gained quite a bit of weight, possibly because of his Depakote therapy. Mother says he demands food. His psychiatrist, Dr. Donnelly Angelica, had said that some of his daytime problems could have been because of insufficient sleep. Mother says he snores loudly, tosses and turns. He does not sleep in the daytime.Vyvanse seemed to reduce his appetite at first, but no longer.  10/17/2015-30 year old male never smoker-Referral by Anastasia Pall and Vicenta Aly, NP; Sleep Study 2011; last seen by CY in 2012 Mentally retarded male with history of OSA complicated by ADHD. With previous effort he wouldn't leave CPAP mask on, pulling and picking at it all night. We eventually stopped CPAP progresses AHI was only 8. Mother and sister here NPSG 12/24/09 AHI 8.1/ hr In recent years he has gained weight. Mental status unchanged. Bedtime between 8 PM and 7 AM without sleep medication. ENT surgery-none. Mother reports perennial nasal congestion. He is unable to blow his nose. Medical problems include hypertension and inability to understand instruction.  ROS             + = pos See HPI Constitutional:   No-   weight loss, night sweats, fevers, chills, fatigue, lassitude. HEENT:   No-  headaches, difficulty swallowing, tooth/dental problems, sore throat,       No-  sneezing, itching, ear ache, +nasal congestion, post nasal drip,  CV:  No-    chest pain, orthopnea, PND, swelling in lower extremities, anasarca, dizziness, palpitations Resp: +shortness of breath with exertion or at rest.              No-   productive cough,  No non-productive cough,  No-  coughing up of blood.              No-   change in color of mucus.  No- wheezing.   Skin: No-   rash or lesions. GI:  No-   heartburn, indigestion, abdominal pain, nausea, vomiting, diarrhea,                 change in bowel habits, loss of appetite GU: No-   dysuria, change in color of urine, no urgency or frequency.  No- flank pain. MS:  No-   joint pain or swelling.  No- decreased range of motion.  No- back pain. Neuro- grossly normal to observation, Or:  Psych:  No- change in his usual mood or affect. No depression or anxiety.  No memory loss.  General- Alert, Oriented, Affect-appropriate, Distress- none acute. Friendly, + mentally limited, speaks few words Skin- rash-none, lesions- none, excoriation- none Lymphadenopathy- none Head- atraumatic            Eyes- Gross vision intact, PERRLA, conjunctivae clear secretions            Ears- Hearing, canals-normal            Nose- Clear, no-Septal dev, mucus, polyps, erosion, perforation  Throat- Mallampati III , mucosa clear , drainage- none, tonsils- atrophic Neck- flexible , trachea midline, no stridor , thyroid nl, carotid no bruit Chest - symmetrical excursion , unlabored           Heart/CV- RRR , no murmur , no gallop  , no rub, nl s1 s2                           - JVD- none , edema- none, stasis changes- none, varices- none           Lung- clear to P&A, wheeze- none, cough- none , dullness-none, rub- none. He mostly was breathing with mouth closed.           Chest wall-  Abd- tender-no, distended-no, bowel sounds-present, HSM- no Br/ Gen/ Rectal- Not done, not indicated Extrem- cyanosis- none, clubbing, none, atrophy- none, strength- nl Neuro- nonfocal,   but significant mental retardation

## 2015-12-08 ENCOUNTER — Encounter (HOSPITAL_BASED_OUTPATIENT_CLINIC_OR_DEPARTMENT_OTHER): Payer: Medicaid Other

## 2015-12-18 ENCOUNTER — Ambulatory Visit (HOSPITAL_BASED_OUTPATIENT_CLINIC_OR_DEPARTMENT_OTHER): Payer: Medicaid Other | Attending: Internal Medicine | Admitting: Radiology

## 2015-12-18 DIAGNOSIS — R0683 Snoring: Secondary | ICD-10-CM | POA: Diagnosis not present

## 2015-12-18 DIAGNOSIS — I10 Essential (primary) hypertension: Secondary | ICD-10-CM | POA: Insufficient documentation

## 2015-12-18 DIAGNOSIS — G4736 Sleep related hypoventilation in conditions classified elsewhere: Secondary | ICD-10-CM | POA: Insufficient documentation

## 2015-12-18 DIAGNOSIS — E669 Obesity, unspecified: Secondary | ICD-10-CM | POA: Insufficient documentation

## 2015-12-18 DIAGNOSIS — Z6839 Body mass index (BMI) 39.0-39.9, adult: Secondary | ICD-10-CM | POA: Insufficient documentation

## 2015-12-18 DIAGNOSIS — Z79899 Other long term (current) drug therapy: Secondary | ICD-10-CM | POA: Insufficient documentation

## 2015-12-18 DIAGNOSIS — G4733 Obstructive sleep apnea (adult) (pediatric): Secondary | ICD-10-CM

## 2015-12-22 DIAGNOSIS — G4733 Obstructive sleep apnea (adult) (pediatric): Secondary | ICD-10-CM | POA: Diagnosis not present

## 2015-12-22 NOTE — Progress Notes (Signed)
  Patient Name: Chad Middleton, Sill Date: 12/18/2015 Gender: Male D.O.B: 02-07-1986 Age (years): 29 Referring Provider: Baird Lyons MD, ABSM Height (inches): 74 Interpreting Physician: Baird Lyons MD, ABSM Weight (lbs): 300 RPSGT: Laren Everts BMI: 39 MRN: [id number] Neck Size: 19.00 CLINICAL INFORMATION Sleep Study Type: NPSG Indication for sleep study: Hypertension, Obesity, OSA, Re-Evaluation, Snoring, Witnessed Apneas Epworth Sleepiness Score: 6  Most recent polysomnogram dated 12/24/2009 revealed an AHI of 8.1/h and RDI of 56.8/h. SLEEP STUDY TECHNIQUE As per the AASM Manual for the Scoring of Sleep and Associated Events v2.3 (April 2016) with a hypopnea requiring 4% desaturations. The channels recorded and monitored were frontal, central and occipital EEG, electrooculogram (EOG), submentalis EMG (chin), nasal and oral airflow, thoracic and abdominal wall motion, anterior tibialis EMG, snore microphone, electrocardiogram, and pulse oximetry.  MEDICATIONS Patient's medications include: charted for review Medications self-administered by patient during sleep study : [place] The study was initiated at 9:54:48 PM and ended at 4:36:28 AM. Sleep onset time was 119.8 minutes and the sleep efficiency was 51.2%. The total sleep time was 205.5 minutes. Stage REM latency was 163.5 minutes. The patient spent 30.17% of the night in stage N1 sleep, 57.91% in stage N2 sleep, 0.00% in stage N3 and 11.92% in REM. Alpha intrusion was absent. Supine sleep was 7.52%. Wake after sleep onset 76 minutes  RESPIRATORY PARAMETERS The overall apnea/hypopnea index (AHI) was 100.1 per hour. There were 215 total apneas, including 197 obstructive, 8 central and 10 mixed apneas. There were 128 hypopneas and 49 RERAs. The AHI during Stage REM sleep was 88.2 per hour. AHI while supine was 124.2 per hour. The mean oxygen saturation was 93.37%. The minimum SpO2 during  sleep was 78.00%. Loud snoring was noted during this study.  CARDIAC DATA The 2 lead EKG demonstrated sinus rhythm. The mean heart rate was 78.28 beats per minute. Other EKG findings include: None.  LEG MOVEMENT DATA The total PLMS were 41 with a resulting PLMS index of 11.97. Associated arousal with leg movement index was 0.0  . IMPRESSIONS - Severe obstructive sleep apnea occurred during this study (AHI = 100.1/h). - No significant central sleep apnea occurred during this study (CAI = 2.3/h). - Moderate oxygen desaturation was noted during this study (Min O2 = 78.00%). - The patient snored with Loud snoring volume. - No cardiac abnormalities were noted during this study. - Mild periodic limb movements of sleep occurred during the study. No significant associated arousals. - Delayed sleep onset - insufficient early sleep to meet protocol requirements for split CPAP titration - Accompanied by patient's mother and required her assistance to help him cooperate for the procedure.  DIAGNOSIS - Obstructive Sleep Apnea (327.23 [G47.33 ICD-10]) - Nocturnal Hypoxemia (327.26 [G47.36 ICD-10])  RECOMMENDATIONS - Therapeutic CPAP titration to determine optimal pressure required to alleviate sleep disordered breathing. - Positional therapy avoiding supine position during sleep. - Avoid alcohol, sedatives and other CNS depressants that may worsen sleep apnea and disrupt normal sleep architecture. - Sleep hygiene should be reviewed to assess factors that may improve sleep quality. - Weight management and regular exercise should be initiated or continued if appropriate.  Deneise Lever Diplomate, American Board of Sleep Medicine  ELECTRONICALLY SIGNED ON:  12/22/2015, 2:15 PM [place] PH: [phone]   FX: [phone] [place]

## 2016-12-14 ENCOUNTER — Ambulatory Visit: Payer: Medicaid Other | Admitting: Internal Medicine

## 2017-09-16 ENCOUNTER — Encounter: Payer: Self-pay | Admitting: Internal Medicine

## 2017-09-16 ENCOUNTER — Ambulatory Visit (INDEPENDENT_AMBULATORY_CARE_PROVIDER_SITE_OTHER): Payer: Medicaid Other | Admitting: Internal Medicine

## 2017-09-16 VITALS — BP 124/66 | HR 74 | Ht 74.0 in | Wt 300.0 lb

## 2017-09-16 DIAGNOSIS — F71 Moderate intellectual disabilities: Secondary | ICD-10-CM

## 2017-09-16 DIAGNOSIS — G4733 Obstructive sleep apnea (adult) (pediatric): Secondary | ICD-10-CM

## 2017-09-16 NOTE — Patient Instructions (Addendum)
Order- new DME, new CPAP auto 5-20, mask of choice, humidifier, supplies, AirView   Dx OSA Family will need in-person support. Patient has limited ability to understand and follow directions.  Please call as needed  The insurance companies usually expect the person to wear CPAP at least 4 hours per night, most nights of each week.

## 2017-09-16 NOTE — Progress Notes (Signed)
HPI male never smoker followed for OSA complicated by mental retardation, unable to be compliant with CPAP or oral appliance. NPSG 12/24/09 AHI 8.1/ hr NPSG 12/18/15- AHI 100.1/ hr, desaturation to 78%, body weight 300 lbs   ---------------------------------------------------------------------------------  06/26/11- 32 year old mentally retarded male with history of obstructive sleep apnea complicated by ADHD. Mother here. Last here- 08/26/2010. PCP Dr Jacklynn Ganong When we had last seen him, he had failed trials of CPAP with several masks, and home oxygen for sleep. The problem was that he would not leave them alone and would not leave them on long enough to be helpful at night. CPAP had been set at 11 [facility]. We had recorded overnight oximetry which showed minimal sleep-related desaturation. On that basis we decided he was better off left alone. Unfortunately since then he has gained quite a bit of weight, possibly because of his Depakote therapy. Mother says he demands food. His psychiatrist, Dr. Donnelly Angelica, had said that some of his daytime problems could have been because of insufficient sleep. Mother says he snores loudly, tosses and turns. He does not sleep in the daytime.Vyvanse seemed to reduce his appetite at first, but no longer.  10/17/2015-32 year old male never smoker-Referral by Anastasia Pall and Vicenta Aly, NP; Sleep Study 2011; last seen by CY in 2012 Mentally retarded male with history of OSA complicated by ADHD. With previous effort he wouldn't leave CPAP mask on, pulling and picking at it all night. We eventually stopped CPAP because AHI was only 8. Mother and sister here NPSG 12/24/09 AHI 8.1/ hr In recent years he has gained weight. Mental status unchanged. Bedtime between 8 PM and 7 AM without sleep medication. ENT surgery-none. Mother reports perennial nasal congestion. He is unable to blow his nose. Medical problems include hypertension and inability to understand  instruction.  09/16/17- 32 year old male never smoker followed for OSA complicated by mental retardation, unable to be compliant with CPAP or oral appliance. ---results from sleep study NPSG 12/18/15- AHI 100.1/ hr, desaturation to 78%, body weight 300 lbs  Mother is with him, saying that family notes him up and pacing around at night, witnessed apneas, loud snoring.  We discussed the severe sleep apnea confirmed on his last study.  We discussed treatment options.  She understands it is possible that there will be no effective treatment to offer him because he is unable to understand or cooperate.  She is willing to try CPAP again.  We are starting CPAP auto 5-20  ROS             + = pos See HPI Constitutional:   No-   weight loss, night sweats, fevers, chills, fatigue, lassitude. HEENT:   No-  headaches, difficulty swallowing, tooth/dental problems, sore throat,       No-  sneezing, itching, ear ache, +nasal congestion, post nasal drip,  CV:  No-   chest pain, orthopnea, PND, swelling in lower extremities, anasarca, dizziness, palpitations Resp: +shortness of breath with exertion or at rest.              No-   productive cough,  No non-productive cough,  No-  coughing up of blood.              No-   change in color of mucus.  No- wheezing.   Skin: No-   rash or lesions. GI:  No-   heartburn, indigestion, abdominal pain, nausea, vomiting, diarrhea,  change in bowel habits, loss of appetite GU: No-   dysuria, change in color of urine, no urgency or frequency.  No- flank pain. MS:  No-   joint pain or swelling.  No- decreased range of motion.  No- back pain. Neuro- grossly normal to observation, Or:  Psych:  No- change in his usual mood or affect. No depression or anxiety.  No memory loss.  General- Alert, Oriented, Affect-appropriate, Distress- none acute. Friendly, + mentally limited, speaks few words Skin- rash-none, lesions- none, excoriation- none Lymphadenopathy- none Head-  atraumatic            Eyes- Gross vision intact, PERRLA, conjunctivae clear secretions            Ears- Hearing, canals-normal            Nose- Clear, no-Septal dev, mucus, polyps, erosion, perforation             Throat- Mallampati III , mucosa clear , drainage- none, tonsils- atrophic Neck- flexible , trachea midline, no stridor , thyroid nl, carotid no bruit Chest - symmetrical excursion , unlabored           Heart/CV- RRR , no murmur , no gallop  , no rub, nl s1 s2                           - JVD- none , edema- none, stasis changes- none, varices- none           Lung- clear to P&A, wheeze- none, cough- none , dullness-none, rub- none. He mostly was breathing with mouth closed.           Chest wall-  Abd- tender-no, distended-no, bowel sounds-present, HSM- no Br/ Gen/ Rectal- Not done, not indicated Extrem- cyanosis- none, clubbing, none, atrophy- none, strength- nl Neuro- nonfocal,   but significant mental retardation

## 2017-09-16 NOTE — Assessment & Plan Note (Signed)
I think he can be trained to some basic tasks I do not know how much understanding he can develop about use of a device and procedure such as CPAP.

## 2017-09-16 NOTE — Assessment & Plan Note (Signed)
He is older now and possibly could learn to put his CPAP mask on and off reliably enough to make the treatment effective. Plan-CPAP auto 5-20

## 2017-10-11 ENCOUNTER — Ambulatory Visit (INDEPENDENT_AMBULATORY_CARE_PROVIDER_SITE_OTHER): Payer: Medicaid Other | Admitting: Internal Medicine

## 2017-10-11 ENCOUNTER — Encounter: Payer: Self-pay | Admitting: Internal Medicine

## 2017-10-11 DIAGNOSIS — G4733 Obstructive sleep apnea (adult) (pediatric): Secondary | ICD-10-CM | POA: Diagnosis not present

## 2017-10-11 DIAGNOSIS — Z23 Encounter for immunization: Secondary | ICD-10-CM

## 2017-10-11 NOTE — Addendum Note (Signed)
Addended by: Georjean Mode on: 10/11/2017 09:41 AM   Modules accepted: Orders

## 2017-10-11 NOTE — Assessment & Plan Note (Signed)
Again patient was uncooperative and unwilling to wear CPAP despite strong family support at effort.  Mother is satisfied he would not leave in an oral appliance or nasal cannula.  She asks for surgery referral.  He would not be an easy candidate.  I pointed out that our goal is to do the best we can for him, which may mean accepting him as he is if we cannot do better.  Keeping weight down would help.

## 2017-10-11 NOTE — Patient Instructions (Addendum)
Order- referral to [facility]    Consider surgical therapy for OSA  Flu vax- standard per Mother's request

## 2017-10-11 NOTE — Progress Notes (Signed)
HPI male never smoker followed for OSA complicated by mental retardation, unable to be compliant with CPAP or oral appliance. NPSG 12/24/09 AHI 8.1/ hr NPSG 12/18/15- AHI 100.1/ hr, desaturation to 78%, body weight 300 lbs   --------------------------------------------------------------------------------.  09/16/17- 32 year old male never smoker followed for OSA complicated by mental retardation, unable to be compliant with CPAP or oral appliance. ---results from sleep study NPSG 12/18/15- AHI 100.1/ hr, desaturation to 78%, body weight 300 lbs  Mother is with him, saying that family notes him up and pacing around at night, witnessed apneas, loud snoring.  We discussed the severe sleep apnea confirmed on his last study.  We discussed treatment options.  She understands it is possible that there will be no effective treatment to offer him because he is unable to understand or cooperate.  She is willing to try CPAP again.  We are starting CPAP auto 5-20  10/11/17- 32 year old male never smoker followed for OSA complicated by mental retardation, unable to be compliant with CPAP or oral appliance ----Pt's mother states pt is not using cpap machine at all. CPAP auto 5-20/ [facility] Mother and family try to get him to wear CPAP but, similar to his experience in the past, he absolutely would not cooperate.  She is convinced he would not wear an oral appliance or oxygen cannula either.  Family has decided they would like to learn about surgical options.  I agreed to the referral, but explained that there are limits to what could be done.  He has little or no capacity to understand or to cooperate. She asks we give him flu shot.  ROS             + = pos See HPI Constitutional:   No-   weight loss, night sweats, fevers, chills, fatigue, lassitude. HEENT:   No-  headaches, difficulty swallowing, tooth/dental problems, sore throat,       No-  sneezing, itching, ear ache, +nasal congestion, post nasal drip,  CV:   No-   chest pain, orthopnea, PND, swelling in lower extremities, anasarca, dizziness, palpitations Resp: +shortness of breath with exertion or at rest.              No-   productive cough,  No non-productive cough,  No-  coughing up of blood.              No-   change in color of mucus.  No- wheezing.   Skin: No-   rash or lesions. GI:  No-   heartburn, indigestion, abdominal pain, nausea, vomiting, diarrhea,                 change in bowel habits, loss of appetite GU: No-   dysuria, change in color of urine, no urgency or frequency.  No- flank pain. MS:  No-   joint pain or swelling.  No- decreased range of motion.  No- back pain. Neuro- grossly normal to observation, Or:  Psych:  No- change in his usual mood or affect. No depression or anxiety.  No memory loss.  General- Alert, Oriented, Affect-appropriate, Distress- none acute. Friendly, + mentally limited- profoundly involved, speaks few words, + overweight Skin- rash-none, lesions- none, excoriation- none Lymphadenopathy- none Head- atraumatic            Eyes- Gross vision intact, PERRLA, conjunctivae clear secretions            Ears- Hearing, canals-normal            Nose- Clear,  no-Septal dev, mucus, polyps, erosion, perforation             Throat- Mallampati III , mucosa clear , drainage- none, tonsils- atrophic Neck- flexible , trachea midline, no stridor , thyroid nl, carotid no bruit Chest - symmetrical excursion , unlabored           Heart/CV- RRR , no murmur , no gallop  , no rub, nl s1 s2                           - JVD- none , edema- none, stasis changes- none, varices- none           Lung- clear to P&A, wheeze- none, cough- none , dullness-none, rub- none. He mostly was breathing with mouth closed.           Chest wall-  Abd- tender-no, distended-no, bowel sounds-present, HSM- no Br/ Gen/ Rectal- Not done, not indicated Extrem- cyanosis- none, clubbing, none, atrophy- none, strength- nl Neuro- nonfocal,   but significant  mental retardation

## 2017-12-17 ENCOUNTER — Encounter: Payer: Self-pay | Admitting: Internal Medicine

## 2017-12-17 ENCOUNTER — Ambulatory Visit: Payer: Medicaid Other | Admitting: Internal Medicine

## 2017-12-17 VITALS — BP 122/74 | HR 83 | Ht 74.0 in | Wt 295.6 lb

## 2017-12-17 DIAGNOSIS — G4733 Obstructive sleep apnea (adult) (pediatric): Secondary | ICD-10-CM

## 2017-12-17 DIAGNOSIS — R351 Nocturia: Secondary | ICD-10-CM

## 2017-12-17 MED ORDER — PROTRIPTYLINE HCL 5 MG PO TABS: ORAL_TABLET | ORAL | 5 refills | Status: DC

## 2017-12-17 MED ORDER — THEOPHYLLINE ER 200 MG PO CP24: ORAL_CAPSULE | ORAL | 5 refills | Status: DC

## 2017-12-17 NOTE — Patient Instructions (Addendum)
Order- referral to [facility]   Nocturia  Scripts printed to try Vivactil and theophylline to see if either helps his breathing during the night. As discussed, they both may act as stimulants, so if they make restlessness worse just stop them.

## 2017-12-17 NOTE — Progress Notes (Signed)
HPI male never smoker followed for OSA complicated by mental retardation, unable to be compliant with CPAP or oral appliance. NPSG 12/24/09 AHI 8.1/ hr NPSG 12/18/15- AHI 100.1/ hr, desaturation to 78%, body weight 300 lbs   --------------------------------------------------------------------------------.  10/11/17- 32 year old male never smoker followed for OSA complicated by mental retardation, unable to be compliant with CPAP or oral appliance ----Pt's mother states pt is not using cpap machine at all. CPAP auto 5-20/ [facility] Mother and family try to get him to wear CPAP but, similar to his experience in the past, he absolutely would not cooperate.  She is convinced he would not wear an oral appliance or oxygen cannula either.  Family has decided they would like to learn about surgical options.  I agreed to the referral, but explained that there are limits to what could be done.  He has little or no capacity to understand or to cooperate. She asks we give him flu shot.  12/17/17 32 year old male never smoker followed for OSA complicated by mental retardation, unable to be compliant with CPAP or oral appliance ----osa-recent ear infection on ABX for that, breathing doing well just waiting on ENT recommendations Saw Dr. Verita Schneiders who did not feel he had anything to offer for OSA. Mother describes him as restless at night but I cannot tell if this is primary, or secondary sleep disturbance from his OSA.  She asks about trying CPAP again with a sedative. I discussed two older, marginal therapies-theophylline and Vivactil.  I have had a few patients seem to benefit from taking these at night although both are potentially stimulants. I went over that very carefully with Mrs. McRay.  ROS             + = pos See HPI Constitutional:   No-   weight loss, night sweats, fevers, chills, fatigue, lassitude. HEENT:   No-  headaches, difficulty swallowing, tooth/dental problems, sore throat,       No-   sneezing, itching, ear ache, +nasal congestion, post nasal drip,  CV:  No-   chest pain, orthopnea, PND, swelling in lower extremities, anasarca, dizziness, palpitations Resp: +shortness of breath with exertion or at rest.              No-   productive cough,  No non-productive cough,  No-  coughing up of blood.              No-   change in color of mucus.  No- wheezing.   Skin: No-   rash or lesions. GI:  No-   heartburn, indigestion, abdominal pain, nausea, vomiting, diarrhea,                 change in bowel habits, loss of appetite GU: No-   dysuria, change in color of urine, no urgency or frequency.  No- flank pain. MS:  No-   joint pain or swelling.  No- decreased range of motion.  No- back pain. Neuro- grossly normal to observation, Or:  Psych:  No- change in his usual mood or affect. No depression or anxiety.  No memory loss.  General- Alert, Oriented, Affect-appropriate, Distress- none acute. Friendly, + mentally limited- profoundly involved, speaks few words, + overweight Skin- rash-none, lesions- none, excoriation- none Lymphadenopathy- none Head- atraumatic            Eyes- Gross vision intact, PERRLA, conjunctivae clear secretions            Ears- Hearing, canals-normal  Nose- Clear, no-Septal dev, mucus, polyps, erosion, perforation             Throat- Mallampati III , mucosa clear , drainage- none, tonsils- atrophic Neck- flexible , trachea midline, no stridor , thyroid nl, carotid no bruit Chest - symmetrical excursion , unlabored           Heart/CV- RRR , no murmur , no gallop  , no rub, nl s1 s2                           - JVD- none , edema- none, stasis changes- none, varices- none           Lung- clear to P&A, wheeze- none, cough- none , dullness-none, rub- none. He mostly was breathing with mouth closed.           Chest wall-  Abd- tender-no, distended-no, bowel sounds-present, HSM- no Br/ Gen/ Rectal- Not done, not indicated Extrem- cyanosis- none,  clubbing, none, atrophy- none, strength- nl Neuro- nonfocal,   but significant mental retardation

## 2017-12-19 NOTE — Assessment & Plan Note (Signed)
I have emphasized that we may not have an intervention better than doing nothing.  I did not expect a surgical solution but wanted her to have the opportunity to talk it through.  There is a small possibility that he might be better with either theophylline or Vivactil, emphasizing that if they increase agitation at night than they are counterproductive.  She agrees.

## 2018-01-05 ENCOUNTER — Telehealth: Payer: Self-pay | Admitting: Internal Medicine

## 2018-01-05 MED ORDER — THEOPHYLLINE ER 400 MG PO TB24: ORAL_TABLET | ORAL | 5 refills | Status: DC

## 2018-01-05 NOTE — Telephone Encounter (Signed)
Per CY-change to [facility] ER 400mg  (scored) take 1/2 tablet daily #30 with 5 refills.

## 2018-04-01 ENCOUNTER — Ambulatory Visit: Payer: Medicaid Other | Admitting: Internal Medicine

## 2018-05-17 ENCOUNTER — Ambulatory Visit: Payer: Medicaid Other | Admitting: Internal Medicine

## 2018-05-24 ENCOUNTER — Encounter: Payer: Self-pay | Admitting: Internal Medicine

## 2018-05-24 ENCOUNTER — Ambulatory Visit: Payer: Medicaid Other | Admitting: Internal Medicine

## 2018-05-24 DIAGNOSIS — Z23 Encounter for immunization: Secondary | ICD-10-CM | POA: Diagnosis not present

## 2018-05-24 DIAGNOSIS — J3089 Other allergic rhinitis: Secondary | ICD-10-CM

## 2018-05-24 DIAGNOSIS — J302 Other seasonal allergic rhinitis: Secondary | ICD-10-CM

## 2018-05-24 DIAGNOSIS — G4733 Obstructive sleep apnea (adult) (pediatric): Secondary | ICD-10-CM

## 2018-05-24 NOTE — Patient Instructions (Signed)
Flu vax- standard  Ok to try vivactil 1 at bedtime each night for a week, then [facility] ER, 1/2 tab at bedtime for a week. If they just make restlessness worse, then stop. If either seems to help him breath and sleep a little easier, then we can continue   Please call as needed

## 2018-05-24 NOTE — Progress Notes (Signed)
HPI male never smoker followed for OSA complicated by mental retardation, unable to be compliant with CPAP or oral appliance. NPSG 12/24/09 AHI 8.1/ hr NPSG 12/18/15- AHI 100.1/ hr, desaturation to 78%, body weight 300 lbs   --------------------------------------------------------------------------------.  12/17/17 32 year old male never smoker followed for OSA complicated by mental retardation, unable to be compliant with CPAP or oral appliance ----osa-recent ear infection on ABX for that, breathing doing well just waiting on ENT recommendations Saw Dr. Verita Schneiders who did not feel he had anything to offer for OSA. Mother describes him as restless at night but I cannot tell if this is primary, or secondary sleep disturbance from his OSA.  She asks about trying CPAP again with a sedative. I discussed two older, marginal therapies-theophylline and Vivactil.  I have had a few patients seem to benefit from taking these at night although both are potentially stimulants. I went over that very carefully with Mrs. Ohman.  05/24/2018- 32 year old male never smoker followed for OSA complicated by mental retardation, unable to be compliant with CPAP or oral appliance -----3 month follow up for OSA. Per mother, no complaints today.  Weight today 301 pounds We suggested [facility] ER 1/2 x 400 mg daily and Vivactil at night, because they have helped a few selected patients with OSA despite stimulant effect.  Mother chose not to try them.  ROS   See HPI          + = positive Constitutional:   No-   weight loss, night sweats, fevers, chills, fatigue, lassitude. HEENT:   No-  headaches, difficulty swallowing, tooth/dental problems, sore throat,       No-  sneezing, itching, ear ache, +nasal congestion, post nasal drip,  CV:  No-   chest pain, orthopnea, PND, swelling in lower extremities, anasarca, dizziness, palpitations Resp: +shortness of breath with exertion or at rest.              No-   productive cough,  No  non-productive cough,  No-  coughing up of blood.              No-   change in color of mucus.  No- wheezing.   Skin: No-   rash or lesions. GI:  No-   heartburn, indigestion, abdominal pain, nausea, vomiting, diarrhea,                 change in bowel habits, loss of appetite GU: No-   dysuria, change in color of urine, no urgency or frequency.  No- flank pain. MS:  No-   joint pain or swelling.  No- decreased range of motion.  No- back pain. Neuro- grossly normal to observation, Or:  Psych:  No- change in his usual mood or affect. No depression or anxiety.  No memory loss.  General- Alert, Oriented, Affect-appropriate, Distress- none acute. Friendly, + mentally limited- profoundly involved, speaks few words, + overweight Skin- rash-none, lesions- none, excoriation- none Lymphadenopathy- none Head- atraumatic            Eyes- Gross vision intact, PERRLA, conjunctivae clear secretions            Ears- Hearing, canals-normal            Nose- Clear, no-Septal dev, mucus, polyps, erosion, perforation             Throat- Mallampati III , mucosa clear , drainage- none, tonsils- atrophic Neck- flexible , trachea midline, no stridor , thyroid nl, carotid no bruit Chest - symmetrical excursion ,  unlabored           Heart/CV- RRR , no murmur , no gallop  , no rub, nl s1 s2                           - JVD- none , edema- none, stasis changes- none, varices- none           Lung- clear to P&A, wheeze- none, cough- none , dullness-none, rub- none. He mostly was breathing with mouth closed.           Chest wall-  Abd- tender-no, distended-no, bowel sounds-present, HSM- no Br/ Gen/ Rectal- Not done, not indicated Extrem- cyanosis- none, clubbing, none, atrophy- none, strength- nl Neuro- nonfocal,   but significant mental retardation

## 2018-06-27 NOTE — Assessment & Plan Note (Signed)
Flu shot

## 2018-06-27 NOTE — Assessment & Plan Note (Signed)
The problem is that he is not able to understand to leave CPAP or an oral appliance alone at night for them to be effective.  Surgery does not appear to be a realistic option. Plan-okay for mother to try Vivactil or theophylline if she chooses to.  Expectations are low but options are poor.

## 2019-01-05 ENCOUNTER — Ambulatory Visit (INDEPENDENT_AMBULATORY_CARE_PROVIDER_SITE_OTHER): Payer: Medicare Other | Admitting: Internal Medicine

## 2019-01-05 ENCOUNTER — Other Ambulatory Visit: Payer: Self-pay

## 2019-01-05 DIAGNOSIS — F71 Moderate intellectual disabilities: Secondary | ICD-10-CM

## 2019-01-05 DIAGNOSIS — G4733 Obstructive sleep apnea (adult) (pediatric): Secondary | ICD-10-CM

## 2019-01-05 NOTE — Progress Notes (Signed)
HPI male never smoker followed for OSA complicated by mental retardation, unable to be compliant with CPAP or oral appliance. NPSG 12/24/09 AHI 8.1/ hr NPSG 12/18/15- AHI 100.1/ hr, desaturation to 78%, body weight 300 lbs   --------------------------------------------------------------------------------.  05/24/2018- 33 year old male never smoker followed for OSA complicated by mental retardation, unable to be compliant with CPAP or oral appliance -----3 month follow up for OSA. Per mother, no complaints today.  Weight today 301 pounds We suggested Chad Middleton 1/2 x 400 mg daily and Vivactil at night, because they have helped a few selected patients with OSA despite stimulant effect.  Mother chose not to try them.  ROS   See HPI          + = positive Constitutional:   No-   weight loss, night sweats, fevers, chills, fatigue, lassitude. HEENT:   No-  headaches, difficulty swallowing, tooth/dental problems, sore throat,       No-  sneezing, itching, ear ache, +nasal congestion, post nasal drip,  CV:  No-   chest pain, orthopnea, PND, swelling in lower extremities, anasarca, dizziness, palpitations Resp: +shortness of breath with exertion or at rest.              No-   productive cough,  No non-productive cough,  No-  coughing up of blood.              No-   change in color of mucus.  No- wheezing.   Skin: No-   rash or lesions. GI:  No-   heartburn, indigestion, abdominal pain, nausea, vomiting, diarrhea,                 change in bowel habits, loss of appetite GU: No-   dysuria, change in color of urine, no urgency or frequency.  No- flank pain. MS:  No-   joint pain or swelling.  No- decreased range of motion.  No- back pain. Neuro- grossly normal to observation, Or:  Psych:  No- change in his usual mood or affect. No depression or anxiety.  No memory loss.  General- Alert, Oriented, Affect-appropriate, Distress- none acute. Friendly, + mentally limited- profoundly involved, speaks few words, +  overweight Skin- rash-none, lesions- none, excoriation- none Lymphadenopathy- none Head- atraumatic            Eyes- Gross vision intact, PERRLA, conjunctivae clear secretions            Ears- Hearing, canals-normal            Nose- Clear, no-Septal dev, mucus, polyps, erosion, perforation             Throat- Mallampati III , mucosa clear , drainage- none, tonsils- atrophic Neck- flexible , trachea midline, no stridor , thyroid nl, carotid no bruit Chest - symmetrical excursion , unlabored           Heart/CV- RRR , no murmur , no gallop  , no rub, nl s1 s2                           - JVD- none , edema- none, stasis changes- none, varices- none           Lung- clear to P&A, wheeze- none, cough- none , dullness-none, rub- none. He mostly was breathing with mouth closed.           Chest wall-  Abd- tender-no, distended-no, bowel sounds-present, HSM- no Br/ Gen/ Rectal- Not done, not indicated Extrem-  cyanosis- none, clubbing, none, atrophy- none, strength- nl Neuro- nonfocal,   but significant mental retardation  01/05/2019- Virtual Visit via Telephone Note  I connected with Chad Middleton on 01/05/19 at 10:00 AM EDT by telephone and verified that I am speaking with the correct person using two identifiers.   I discussed the limitations, risks, security and privacy concerns of performing an evaluation and management service by telephone and the availability of in person appointments. I also discussed with the patient that there may be a patient responsible charge related to this service. The patient expressed understanding and agreed to proceed.   History of Present Illness: 33 year old male never smoker followed for OSA complicated by mental retardation, unable to be compliant with CPAP or oral appliance. Dr Janace Hoard ENT had not felt he had anything to offer.  FOLLOWS FOR: Mother states that the pt is doing well the protriptyline and theophylline prescribed at this last OV. She is wanting to  get the pt back on CPAP now that he is doing well. Mother reports he is now sleeping better with protriptyline and/ or theophylline, as we hoped, despite these being stimulants. Mother wasn't sure which one she was giving and is going to check name on bottle. She hopes now we can try again with CPAP and that he might be compliant and able to leave mask on at night.  She understands he will need new sleep study to update, and it will need to be a supervised, in-center study. Last study was 2017.   Observations/Objective: NPSG 12/24/09 AHI 8.1/ hr NPSG 12/18/15- AHI 100.1/ hr, desaturation to 78%, body weight 300 lbs   Assessment and Plan: OSA- for update sleep study when Covid rules allow Mental retardation  Follow Up Instructions: On list for sleep study   I discussed the assessment and treatment plan with the patient. The patient was provided an opportunity to ask questions and all were answered. The patient agreed with the plan and demonstrated an understanding of the instructions.   The patient was advised to call back or seek an in-person evaluation if the symptoms worsen or if the condition fails to improve as anticipated.  I provided 18 minutes of non-face-to-face time during this encounter.   Baird Lyons, MD

## 2019-01-05 NOTE — Patient Instructions (Signed)
Ok to continue current medicines  Order- schedule NPSG split protocol at sleep center    Dx OSA  Please call us after that study is done, so we can make plans for follow-up.

## 2019-01-10 ENCOUNTER — Encounter: Payer: Self-pay | Admitting: Internal Medicine

## 2019-01-10 NOTE — Assessment & Plan Note (Signed)
I am not sure if he can accept wearing a CPAP, but we can try. Overall his status is stable, with very limited understanding.

## 2019-01-10 NOTE — Assessment & Plan Note (Signed)
Mother wants to try again with CPAP since he is sleeping more soundly. She hopes he will leave mask alone.  Plan- schedule in-center NPSG. He needs tech and mother supervision.

## 2019-02-20 ENCOUNTER — Other Ambulatory Visit (HOSPITAL_COMMUNITY)
Admission: RE | Admit: 2019-02-20 | Discharge: 2019-02-20 | Disposition: A | Discharge status: Home / Self Care | Payer: Medicare Other | Source: Ambulatory Visit | Attending: Internal Medicine | Admitting: Internal Medicine

## 2019-02-20 ENCOUNTER — Other Ambulatory Visit: Payer: Self-pay

## 2019-02-20 DIAGNOSIS — Z1159 Encounter for screening for other viral diseases: Secondary | ICD-10-CM | POA: Insufficient documentation

## 2019-02-20 LAB — SARS CORONAVIRUS 2 BY RT PCR (HOSPITAL ORDER, PERFORMED IN ~~LOC~~ HOSPITAL LAB): SARS Coronavirus 2: NEGATIVE

## 2019-02-21 ENCOUNTER — Telehealth: Payer: Self-pay | Admitting: Adult Health

## 2019-02-21 NOTE — Telephone Encounter (Signed)
Please let patient know SARS COVID 19 is negative .

## 2019-02-22 ENCOUNTER — Ambulatory Visit (HOSPITAL_BASED_OUTPATIENT_CLINIC_OR_DEPARTMENT_OTHER): Payer: Medicare Other | Attending: Internal Medicine | Admitting: Internal Medicine

## 2019-02-22 ENCOUNTER — Other Ambulatory Visit: Payer: Self-pay

## 2019-02-22 VITALS — Ht 74.0 in | Wt 295.0 lb

## 2019-02-22 DIAGNOSIS — Z79899 Other long term (current) drug therapy: Secondary | ICD-10-CM | POA: Insufficient documentation

## 2019-02-22 DIAGNOSIS — Z6838 Body mass index (BMI) 38.0-38.9, adult: Secondary | ICD-10-CM | POA: Insufficient documentation

## 2019-02-22 DIAGNOSIS — E669 Obesity, unspecified: Secondary | ICD-10-CM | POA: Diagnosis not present

## 2019-02-22 DIAGNOSIS — G4733 Obstructive sleep apnea (adult) (pediatric): Secondary | ICD-10-CM | POA: Diagnosis not present

## 2019-02-22 DIAGNOSIS — I10 Essential (primary) hypertension: Secondary | ICD-10-CM | POA: Diagnosis not present

## 2019-02-22 NOTE — Telephone Encounter (Signed)
Called pt and spoke with his mother Delores letting her know the results of pt's lab test. Delores expressed understanding. Nothing further needed.

## 2019-02-26 DIAGNOSIS — G4733 Obstructive sleep apnea (adult) (pediatric): Secondary | ICD-10-CM

## 2019-02-26 NOTE — Procedures (Signed)
Patient Name: Chad Middleton, Chad Middleton Date: 02/22/2019 Gender: Male D.O.B: 1985-11-11 Age (years): 89 Referring Provider: Baird Lyons MD, ABSM Height (inches): 74 Interpreting Physician: Baird Lyons MD, ABSM Weight (lbs): 295 RPSGT: Laren Everts BMI: 38 MRN: [id number] Neck Size: 20.00  CLINICAL INFORMATION Sleep Study Type: NPSG Indication for sleep study: Fatigue, Hypertension, Obesity, OSA, Re-Evaluation, Snoring Epworth Sleepiness Score: 4  Most recent polysomnogram dated 12/18/2015 revealed an AHI of 100.1/h and RDI of 114.5/h. SLEEP STUDY TECHNIQUE As per the AASM Manual for the Scoring of Sleep and Associated Events v2.3 (April 2016) with a hypopnea requiring 4% desaturations.  The channels recorded and monitored were frontal, central and occipital EEG, electrooculogram (EOG), submentalis EMG (chin), nasal and oral airflow, thoracic and abdominal wall motion, anterior tibialis EMG, snore microphone, electrocardiogram, and pulse oximetry.  MEDICATIONS Medications self-administered by patient taken the night of the study : DEPAKOTE ER, RISPERIDONE  SLEEP ARCHITECTURE The study was initiated at 10:09:18 PM and ended at 5:08:07 AM.  Sleep onset time was 188.1 minutes and the sleep efficiency was 49.7%%. The total sleep time was 208.2 minutes.  Stage REM latency was N/A minutes.  The patient spent 8.0%% of the night in stage N1 sleep, 92.0%% in stage N2 sleep, 0.0%% in stage N3 and 0% in REM.  Alpha intrusion was absent.  Supine sleep was 17.72%.  RESPIRATORY PARAMETERS The overall apnea/hypopnea index (AHI) was 17.0 per hour. There were 18 total apneas, including 17 obstructive, 0 central and 1 mixed apneas. There were 41 hypopneas and 37 RERAs.  The AHI during Stage REM sleep was N/A per hour.  AHI while supine was 16.3 per hour.  The mean oxygen saturation was 94.3%. The minimum SpO2 during sleep was 86.0%.  moderate snoring was noted during this study.   CARDIAC DATA The 2 lead EKG demonstrated sinus rhythm. The mean heart rate was 73.1 beats per minute. Other EKG findings include: None.  LEG MOVEMENT DATA The total PLMS were 0 with a resulting PLMS index of 0.0. Associated arousal with leg movement index was 0.0 .  IMPRESSIONS - Moderate obstructive sleep apnea occurred during this study (AHI = 17.0/h). - Sleep onset was delayed until 1:00 AM. There was insufficient early sleep to meet protocol requirement for split CPAP titration. - No significant central sleep apnea occurred during this study (CAI = 0.0/h). - Mild oxygen desaturation was noted during this study (Min O2 = 86.0%). Mean sat 94.3%. - The patient snored with moderate snoring volume. - No cardiac abnormalities were noted during this study. - Clinically significant periodic limb movements did not occur during sleep. No significant associated arousals.  DIAGNOSIS - Obstructive Sleep Apnea (327.23 [G47.33 ICD-10])  RECOMMENDATIONS - Suggest CPAP titration sleep study or autopap. Other options would be based on clinical judgment. - Be careful with alcohol, sedatives and other CNS depressants that may worsen sleep apnea and disrupt normal sleep architecture. - Sleep hygiene should be reviewed to assess factors that may improve sleep quality. - Weight management and regular exercise should be initiated or continued if appropriate.  [Electronically signed] 02/26/2019 01:48 PM  Baird Lyons MD, [place], American Board of Sleep Medicine   NPI: [id number]                          [place], [place] [facility]  ELECTRONICALLY SIGNED ON:  02/26/2019, 1:44 PM [place] PH: [phone]   FX: [phone]  [place] [facility]

## 2019-02-28 ENCOUNTER — Encounter: Payer: Self-pay | Admitting: Primary Care

## 2019-02-28 ENCOUNTER — Other Ambulatory Visit: Payer: Self-pay

## 2019-02-28 ENCOUNTER — Ambulatory Visit (INDEPENDENT_AMBULATORY_CARE_PROVIDER_SITE_OTHER): Payer: Medicare Other | Admitting: Primary Care

## 2019-02-28 DIAGNOSIS — G4700 Insomnia, unspecified: Secondary | ICD-10-CM | POA: Insufficient documentation

## 2019-02-28 DIAGNOSIS — G4733 Obstructive sleep apnea (adult) (pediatric): Secondary | ICD-10-CM | POA: Diagnosis not present

## 2019-02-28 DIAGNOSIS — G4709 Other insomnia: Secondary | ICD-10-CM | POA: Diagnosis not present

## 2019-02-28 NOTE — Patient Instructions (Addendum)
Obstructive sleep apnea is mild-moderate  Treatment options include weight loss, side sleeping position, CPAP, oral appliance   Continue to monitor for now  Limit screen time before bed, establish a normal sleep time routine (lights off, same time each night, cool/dark environment)  Follow-up with Dr. Annamaria Boots in 3 months     Insomnia Insomnia is a sleep disorder that makes it difficult to fall asleep or stay asleep. Insomnia can cause fatigue, low energy, difficulty concentrating, mood swings, and poor performance at work or school. There are three different ways to classify insomnia:  Difficulty falling asleep.  Difficulty staying asleep.  Waking up too early in the morning. Any type of insomnia can be long-term (chronic) or short-term (acute). Both are common. Short-term insomnia usually lasts for three months or less. Chronic insomnia occurs at least three times a week for longer than three months. What are the causes? Insomnia may be caused by another condition, situation, or substance, such as:  Anxiety.  Certain medicines.  Gastroesophageal reflux disease (GERD) or other gastrointestinal conditions.  Asthma or other breathing conditions.  Restless legs syndrome, sleep apnea, or other sleep disorders.  Chronic pain.  Menopause.  Stroke.  Abuse of alcohol, tobacco, or illegal drugs.  Mental health conditions, such as depression.  Caffeine.  Neurological disorders, such as Alzheimer's disease.  An overactive thyroid (hyperthyroidism). Sometimes, the cause of insomnia may not be known. What increases the risk? Risk factors for insomnia include:  Gender. Women are affected more often than men.  Age. Insomnia is more common as you get older.  Stress.  Lack of exercise.  Irregular work schedule or working night shifts.  Traveling between different time zones.  Certain medical and mental health conditions. What are the signs or symptoms? If you have  insomnia, the main symptom is having trouble falling asleep or having trouble staying asleep. This may lead to other symptoms, such as:  Feeling fatigued or having low energy.  Feeling nervous about going to sleep.  Not feeling rested in the morning.  Having trouble concentrating.  Feeling irritable, anxious, or depressed. How is this diagnosed? This condition may be diagnosed based on:  Your symptoms and medical history. Your health care provider may ask about: ? Your sleep habits. ? Any medical conditions you have. ? Your mental health.  A physical exam. How is this treated? Treatment for insomnia depends on the cause. Treatment may focus on treating an underlying condition that is causing insomnia. Treatment may also include:  Medicines to help you sleep.  Counseling or therapy.  Lifestyle adjustments to help you sleep better. Follow these instructions at home: Eating and drinking   Limit or avoid alcohol, caffeinated beverages, and cigarettes, especially close to bedtime. These can disrupt your sleep.  Do not eat a large meal or eat spicy foods right before bedtime. This can lead to digestive discomfort that can make it hard for you to sleep. Sleep habits   Keep a sleep diary to help you and your health care provider figure out what could be causing your insomnia. Write down: ? When you sleep. ? When you wake up during the night. ? How well you sleep. ? How rested you feel the next day. ? Any side effects of medicines you are taking. ? What you eat and drink.  Make your bedroom a dark, comfortable place where it is easy to fall asleep. ? Put up shades or blackout curtains to block light from outside. ? Use a white noise  machine to block noise. ? Keep the temperature cool.  Limit screen use before bedtime. This includes: ? Watching TV. ? Using your smartphone, tablet, or computer.  Stick to a routine that includes going to bed and waking up at the same times  every day and night. This can help you fall asleep faster. Consider making a quiet activity, such as reading, part of your nighttime routine.  Try to avoid taking naps during the day so that you sleep better at night.  Get out of bed if you are still awake after 15 minutes of trying to sleep. Keep the lights down, but try reading or doing a quiet activity. When you feel sleepy, go back to bed. General instructions  Take over-the-counter and prescription medicines only as told by your health care provider.  Exercise regularly, as told by your health care provider. Avoid exercise starting several hours before bedtime.  Use relaxation techniques to manage stress. Ask your health care provider to suggest some techniques that may work well for you. These may include: ? Breathing exercises. ? Routines to release muscle tension. ? Visualizing peaceful scenes.  Make sure that you drive carefully. Avoid driving if you feel very sleepy.  Keep all follow-up visits as told by your health care provider. This is important. Contact a health care provider if:  You are tired throughout the day.  You have trouble in your daily routine due to sleepiness.  You continue to have sleep problems, or your sleep problems get worse. Get help right away if:  You have serious thoughts about hurting yourself or someone else. If you ever feel like you may hurt yourself or others, or have thoughts about taking your own life, get help right away. You can go to your nearest emergency department or call:  Your local emergency services (911 in the [place]  A suicide crisis helpline, such as the [place] at (256)659-7807. This is open 24 hours a day. Summary  Insomnia is a sleep disorder that makes it difficult to fall asleep or stay asleep.  Insomnia can be long-term (chronic) or short-term (acute).  Treatment for insomnia depends on the cause. Treatment may focus on treating an  underlying condition that is causing insomnia.  Keep a sleep diary to help you and your health care provider figure out what could be causing your insomnia. This information is not intended to replace advice given to you by your health care provider. Make sure you discuss any questions you have with your health care provider. Document Released: 08/28/2000 Document Revised: 06/10/2017 Document Reviewed: 06/10/2017 Elsevier Interactive Patient Education  2019 [employer].

## 2019-02-28 NOTE — Progress Notes (Signed)
@Patient  ID: Chad Middleton, male    DOB: 04/30/1986, 33 y.o.   MRN: [id number]  Chief Complaint  Patient presents with  . Follow-up    f/u sleep study    Referring provider: Vicenta Aly, FNP  HPI: 33 year old male, never smoked. PMH OSA, seasonal and perennial allergic rhinitis, ADHD, MR. Patient of Dr. Annamaria Boots, last seen on 01/05/19.   02/28/2019 Patient presents today for sleep study results. Split night study 02/22/19 showed moderate obstructive sleep apnea with AHI 16.3/hr; minimum SpO2 86%. Delayed sleep onset, moderate snoring. No cardiac abnormalities. Mother does not think he will be able to tolerate CPAP, states that he is up all night and walks around. His father checks on him before going to work in the morning around 3-4am and Chad Middleton is awake. Continues on Risperidone, sertraline and depakote.   No Known Allergies  Immunization History  Administered Date(s) Administered  . Influenza Split 09/24/2011  . Influenza Whole 07/18/2010  . Influenza, Seasonal, Injecte, Preservative Fre 10/17/2015, 05/24/2018  . Influenza,inj,Quad PF,6+ Mos 10/17/2015, 10/11/2017, 05/24/2018    Past Medical History:  Diagnosis Date  . ADHD (attention deficit hyperactivity disorder)   . Anxiety   . Hypertension   . Incontinent of urine   . Mental retardation    simple photos will recognize, not likely to verbalize much.  . OSA (obstructive sleep apnea)    no cpap use-pt will not use    Tobacco History: Social History   Tobacco Use  Smoking Status Never Smoker  Smokeless Tobacco Never Used   Counseling given: Not Answered   Outpatient Medications Prior to Visit  Medication Sig Dispense Refill  . amLODipine-benazepril (LOTREL) 5-20 MG per capsule Take 1 capsule by mouth every morning.    . divalproex (DEPAKOTE ER) 500 MG 24 hr tablet Take 500-1,000 mg by mouth 2 (two) times daily. Take 500mg  in the morning and take 1000mg  at bedtime    . guanFACINE (TENEX) 1 MG tablet Take 1 mg by  mouth daily.    . hydrochlorothiazide (HYDRODIURIL) 25 MG tablet Take 25 mg by mouth daily.    Marland Kitchen loratadine (CLARITIN) 10 MG tablet Take 10 mg by mouth every morning.     . risperiDONE (RISPERDAL) 1 MG tablet Take 1 mg by mouth 2 (two) times daily.    . sertraline (ZOLOFT) 100 MG tablet Take 200 mg by mouth every morning.    . theophylline (UNIPHYL) 400 MG 24 hr tablet Take 1/2 tablet by mouth daily 30 tablet 5  . oxybutynin (DITROPAN-XL) 5 MG 24 hr tablet Take 5 mg by mouth at bedtime.    . protriptyline (VIVACTIL) 5 MG tablet 1 daily at bedtime (Patient not taking: Reported on 02/28/2019) 30 tablet 5   No facility-administered medications prior to visit.    Review of Systems  Review of Systems  Constitutional: Negative.   HENT: Negative.   Respiratory: Negative.   Cardiovascular: Negative.    Physical Exam  BP 110/68 (BP Location: Right Arm, Cuff Size: Large)   Pulse 95   Temp 97.9 F (36.6 C)   Ht 6\' 2"  (1.88 m)   Wt (!) 301 lb 3.2 oz (136.6 kg)   SpO2 99%   BMI 38.67 kg/m  Physical Exam Constitutional:      General: He is not in acute distress.    Appearance: Normal appearance. He is obese. He is not ill-appearing, toxic-appearing or diaphoretic.  HENT:     Mouth/Throat:     Mouth: Mucous  membranes are moist.     Pharynx: Oropharynx is clear.  Neck:     Musculoskeletal: Normal range of motion and neck supple.  Cardiovascular:     Rate and Rhythm: Normal rate.  Pulmonary:     Effort: Pulmonary effort is normal.     Breath sounds: No wheezing.  Musculoskeletal: Normal range of motion.  Skin:    General: Skin is warm and dry.  Neurological:     General: No focal deficit present.     Mental Status: He is alert and oriented to person, place, and time. Mental status is at baseline.  Psychiatric:        Mood and Affect: Mood normal.        Behavior: Behavior normal.        Thought Content: Thought content normal.        Judgment: Judgment normal.      Lab  Results:  CBC    Component Value Date/Time   WBC 4.8 10/19/2014 1105   RBC 4.75 10/19/2014 1105   HGB 14.7 10/19/2014 1105   HCT 44.3 10/19/2014 1105   PLT 250 10/19/2014 1105   MCV 93.3 10/19/2014 1105   MCH 30.9 10/19/2014 1105   MCHC 33.2 10/19/2014 1105   RDW 12.6 10/19/2014 1105   LYMPHSABS 2.4 10/19/2014 1105   MONOABS 0.3 10/19/2014 1105   EOSABS 0.1 10/19/2014 1105   BASOSABS 0.0 10/19/2014 1105    BMET    Component Value Date/Time   NA 136 10/19/2014 1105   K 4.2 10/19/2014 1105   CL 100 10/19/2014 1105   CO2 30 10/19/2014 1105   GLUCOSE 83 10/19/2014 1105   BUN 15 10/19/2014 1105   CREATININE 1.10 10/19/2014 1105   CALCIUM 9.6 10/19/2014 1105   GFRNONAA >90 10/19/2014 1105   GFRAA >90 10/19/2014 1105    BNP No results found for: BNP  ProBNP No results found for: PROBNP  Imaging: No results found.   Assessment & Plan:   OSA (obstructive sleep apnea) - NPSG  02/22/19 showed moderate obstructive sleep apnea with AHI 16.3/hr; minimum SpO2 86% - Mother does not think he will be able to tolerate CPAP - Continue to monitor - Recommend weight loss and side sleeping position - FU with Dr. Annamaria Boots in 2 months   Insomnia - Delayed sleep onset during split night sleep study - Mother reports he is up all night walking around - Sleep hygiene reviewed - Continues risperidone 1mg  twice daily per [facility] behavioral health    Martyn Ehrich, NP 02/28/2019

## 2019-02-28 NOTE — Assessment & Plan Note (Signed)
-   Delayed sleep onset during split night sleep study - Mother reports he is up all night walking around - Sleep hygiene reviewed - Continues risperidone 1mg  twice daily per [facility] health

## 2019-02-28 NOTE — Assessment & Plan Note (Signed)
-   NPSG  02/22/19 showed moderate obstructive sleep apnea with AHI 16.3/hr; minimum SpO2 86% - Mother does not think he will be able to tolerate CPAP - Continue to monitor - Recommend weight loss and side sleeping position - FU with Dr. Annamaria Boots in 2 months

## 2019-03-23 ENCOUNTER — Telehealth: Payer: Self-pay | Admitting: Internal Medicine

## 2019-03-23 DIAGNOSIS — G4733 Obstructive sleep apnea (adult) (pediatric): Secondary | ICD-10-CM

## 2019-03-23 NOTE — Telephone Encounter (Signed)
Received the following message from CY:  Pt's sleep study did show obstructive sleep apnea.  As discussed at last office visit, we can try again with new DME, new CPAP, auto 5-15, mask of choice, humidifier, supplies, AirView. He is special needs/ disabled and important that mother be trained by DME.   Pt last seen 02/28/2019 by Eustaquio Maize, who resulted sleep study for pt. She mentioned the treatment options to the pt, however, no referral for CPAP was placed.   Called & spoke w/ pt's legal guardian, Chad Middleton, who stated she discussed this matter with Chad Middleton and states she does not think CPAP would be applicable for pt because he would refuse to wear it. She also states that the two medications Dr. Annamaria Boots prescribed for pt, theophylline and protriptyline, are not therapeutic for the pt. Pt's next appt w/ CY is on 06/19/2019 at 2:30 PM. I let Chad Middleton know I would route a message to Dr. Annamaria Boots to see if he has any further recommendations.   CY, please advise with your recommendations for this pt. Thank you.

## 2019-03-23 NOTE — Telephone Encounter (Signed)
His mother had thought he was sleeping better with theophylline and vivactil and was now calm enough at night that she hoped to retry CPAP. He had not kept CPAP on when tried before. Unfortunately I don't have anything to offer. Ok to stop the theophylline and protriptyline(Vivactil).

## 2019-03-24 NOTE — Telephone Encounter (Signed)
Ok to refer to Dr Redmond Baseman ENT to discuss Inspire therapy since not a candidate for CPAP or Oral appliance.

## 2019-03-24 NOTE — Telephone Encounter (Signed)
Called & spoke w/ pt's mother, Delores. Pt mother states when she originally spoke w/ CY she was not aware that pt was not sleeping well while taking theophylline and vivactil. She states the medications worked at first, but her sister and other family member reported pt not sleeping well after some time on the medications.   Pt mother is inquiring about [facility] surgery. She states she understands pt only has moderate sleep apnea, but is wondering if this would be an option for him since theophylline and vivactil are non-therapeutic and since pt cannot tolerate CPAP.   CY, please advise. Thank you.

## 2019-03-24 NOTE — Telephone Encounter (Addendum)
Called & spoke w/ pt's mother/legal guardian, Delores, regarding CY's recommendations. Pt mother verbalized understanding, agreed to these measures, and had no additional questions.   Referral order to Dr. Redmond Baseman, ENT, has been placed. Nothing further needed at this time.

## 2019-04-28 ENCOUNTER — Telehealth: Payer: Self-pay | Admitting: Internal Medicine

## 2019-04-28 NOTE — Telephone Encounter (Signed)
Dr. Redmond Baseman ENT is whom the referral was to. Their office phone number is [phone].   Called and spoke with pt's mother Chad Middleton providing her with the info above. Delores verbalized understanding. Nothing further needed.

## 2019-05-19 ENCOUNTER — Other Ambulatory Visit: Payer: Self-pay

## 2019-05-19 DIAGNOSIS — Z20822 Contact with and (suspected) exposure to covid-19: Secondary | ICD-10-CM

## 2019-05-21 LAB — NOVEL CORONAVIRUS, NAA: SARS-CoV-2, NAA: NOT DETECTED

## 2019-06-19 ENCOUNTER — Ambulatory Visit: Payer: Medicare Other | Admitting: Internal Medicine

## 2019-12-06 ENCOUNTER — Encounter: Payer: Self-pay | Admitting: Registered"

## 2019-12-06 ENCOUNTER — Encounter: Payer: Medicare Other | Attending: Nurse Practitioner | Admitting: Registered"

## 2019-12-06 ENCOUNTER — Other Ambulatory Visit: Payer: Self-pay

## 2019-12-06 DIAGNOSIS — Z713 Dietary counseling and surveillance: Secondary | ICD-10-CM | POA: Insufficient documentation

## 2019-12-06 DIAGNOSIS — E669 Obesity, unspecified: Secondary | ICD-10-CM | POA: Insufficient documentation

## 2019-12-06 DIAGNOSIS — Z6839 Body mass index (BMI) 39.0-39.9, adult: Secondary | ICD-10-CM | POA: Diagnosis not present

## 2019-12-06 DIAGNOSIS — E162 Hypoglycemia, unspecified: Secondary | ICD-10-CM | POA: Diagnosis not present

## 2019-12-06 DIAGNOSIS — I10 Essential (primary) hypertension: Secondary | ICD-10-CM | POA: Insufficient documentation

## 2019-12-06 NOTE — Patient Instructions (Addendum)
Instructions/Goals:  Make sure to get in three meals per day. Try to have balanced meals like the My Plate example (see handout). Include lean proteins, vegetables, fruits, and whole grains at meals.   -Water goal: 4 bottles/64 oz per day  -Try some new whole grains. May try mixing brown rice with white rice.  -Continue limiting high sodium foods-limit those with 20% daily value or more on label.  Make physical activity a part of your week. Try to include at least 30 minutes of physical activity 5 days each week or at least 150 minutes per week. Regular physical activity promotes overall health-including helping to reduce risk for heart disease and diabetes, promoting mental health, and helping [place] sleep better.   Turn on music and encourage dancing most days of the week for at least 10 minutes at a time   If sitting for a long time, may encourage standing and walking in place while watching TV. May start with 1-2 minutes at a time and work up to 5-10 minutes   [employer] Videos:    [place] at [employer] by [employer] [employer] on [employer] on [address] has free videos

## 2019-12-06 NOTE — Progress Notes (Signed)
Medical Nutrition Therapy:  Appt start time: 1117 end time:  1217.  Assessment:  Primary concerns today: Pt referred due to weight management and HTN. Pt has been dx with intellectual disability, HTN, Bipolar 1 disorder, ADHD, and OSA.   Pt present for appointment with mother. Pt spoke some words during appointment and was engaged. Mother provided pt information for appointment. Mother reports concerns about pt's weight. Reports pt has good appetite which will make changing things a challenge. Mother reports that their whole family needs to make some dietary changes. Reports they have started baking and grilling more in place of frying. Mother reports they do like to use fat back in their cooking.   Reports concern about diabetes and pt's sleep apnea. Reports pt used to wear his CPAP but will no longer wear it. Mother reports pt would sleep until noon or 1 PM if allowed. Reports pt sleeps sitting up. Pt goes to bed around 9-10 PM. Mother feels pt falls asleep pretty soon after laying down. Mother denies pt getting up during the night.   Pt used to go to [facility] but not currently due to COVID concerns. Mother reports getting physical activity is a challenge since the pandemic and pt not going to day program. Reports pt has anxiety which prevents walking outside from being a good option for activity. Pt does like to listen to music and dance.  Food Allergies/Intolerances: Reports high intake of chocolate sometimes causes diarrhea. If pt eats a lot of leafy greens will also have loose stools.   GI Concerns: None reported.   Pertinent Lab Values: 11/07/19: HDL Cholesterol: 34  Preferred Learning Style:   No preference indicated   Learning Readiness:   Ready  MEDICATIONS: See list.    DIETARY INTAKE:  Usual eating pattern includes 2 meals and maybe 1 snack per day. Typically eats lunch and dinner because pt is not usually up early enough for breakfast.   Common foods: chicken, broccoli  with cheese, corn, green beans, yams, rice.  Avoided foods: cottage cheese.    Typical Snacks: chips. Mother reports she knows she needs to change snacks to fruits and vegetables.   Typical Beverages: 1-2 bottles of water, flavored water, plain whole milk with cereal. Used to drink orange juice.    Location of Meals: at same time as family. At kitchen table-no one else eats there with him.  Mother reports the rest of the family needs to start eating at the table as well.   Electronics Present at [employer]: No  24-hr recall:  B ( AM): Honey Bunches of Oats or similar cereal with whole milk Snk ( AM): None reported.  L ( PM): None reported-mother unsure as she was at work Snk ( PM): maybe some chips  D ( PM): Unsure-brother prepared food.  Snk ( PM): None reported.  Beverages: at least 1-2 bottles water  Usual physical activity: None currently. Minutes/Week: N/A Limited to indoor activities due to pt having anxiety when walking outdoors. Pt does like dancing to music.   Progress Towards Goal(s):  In progress.   Nutritional Diagnosis:  NB-2.1 Physical inactivity As related to sedentary lifestyle, anxiety when walking outdoors, covid-19.  As evidenced by reported activity recall, pt no longer going to day program due to pandemic. NI-5.11.1 Predicted suboptimal nutrient intake As related to inadequate intake of water and whole grains.  As evidenced by dietary report and habits provided by pt's mother.    Intervention:  Nutrition counseling provided. Dietitian provided education  regarding balanced nutrition. Also provided education on benefits of physical activity and ways to be active indoors. Discussed increasing water intake, trying some whole grains, heart healthy cooking methods and limiting high sodium foods by viewing label. Mother and pt appeared agreeable to information/goals discussed.   Instructions/Goals:  Make sure to get in three meals per day. Try to have balanced meals like  the My Plate example (see handout). Include lean proteins, vegetables, fruits, and whole grains at meals.   -Water goal: 4 bottles/64 oz per day  -Try some new whole grains. May try mixing brown rice with white rice.  -Continue limiting high sodium foods-limit those with 20% daily value or more on label.  Make physical activity a part of your week. Try to include at least 30 minutes of physical activity 5 days each week or at least 150 minutes per week. Regular physical activity promotes overall health-including helping to reduce risk for heart disease and diabetes, promoting mental health, and helping [place] sleep better.    Turn on music and encourage dancing most days of the week for at least 10 minutes at a time   If sitting for a long time, may encourage standing and walking in place while watching TV. May start with 1-2 minutes at a time and work up to 5-10 minutes   [employer] Videos:    [place] [facility] by [place] [employer] on [employer] on [place] has free videos  Teaching Method Utilized:  [employer]  Handouts given during visit include:  Balanced plate and food list  Balanced snacks  Greek Yogurt Vegetable Dip recipe  Barriers to learning/adherence to lifestyle change: pt dx with intellectual disability  Demonstrated degree of understanding via:  Teach Back   Monitoring/Evaluation:  Dietary intake, exercise, and body weight in 2 month(s).

## 2020-01-31 ENCOUNTER — Ambulatory Visit: Payer: Medicare Other | Admitting: Registered"

## 2021-04-03 ENCOUNTER — Encounter (HOSPITAL_COMMUNITY): Payer: Self-pay | Admitting: Emergency Medicine

## 2021-04-03 ENCOUNTER — Other Ambulatory Visit: Payer: Self-pay

## 2021-04-03 ENCOUNTER — Ambulatory Visit (HOSPITAL_COMMUNITY)
Admission: EM | Admit: 2021-04-03 | Discharge: 2021-04-03 | Disposition: A | Discharge status: Home / Self Care | Payer: Medicare Other

## 2021-04-03 DIAGNOSIS — M7918 Myalgia, other site: Secondary | ICD-10-CM | POA: Diagnosis not present

## 2021-04-03 MED ORDER — MELOXICAM 7.5 MG PO TABS: 7.5000 mg | ORAL_TABLET | Freq: Every day | ORAL | 0 refills | Status: DC

## 2021-04-03 NOTE — Discharge Instructions (Addendum)
Can use meloxicam every morning with food for 5 days then as needed  Heating pad 15 minute intervals, warm soaks if tolerated   Gentle stretching  Pillows for support  Orthopedic follow up in 2 weeks if pain persist

## 2021-04-03 NOTE — ED Notes (Signed)
unable to assess
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
## 2022-06-10 ENCOUNTER — Ambulatory Visit (INDEPENDENT_AMBULATORY_CARE_PROVIDER_SITE_OTHER): Payer: Medicare Other | Admitting: Podiatry

## 2022-06-10 DIAGNOSIS — B999 Unspecified infectious disease: Secondary | ICD-10-CM | POA: Diagnosis not present

## 2022-06-10 DIAGNOSIS — L988 Other specified disorders of the skin and subcutaneous tissue: Secondary | ICD-10-CM | POA: Diagnosis not present

## 2022-06-10 MED ORDER — DOXYCYCLINE HYCLATE 100 MG PO TABS: 100.0000 mg | ORAL_TABLET | Freq: Two times a day (BID) | ORAL | 0 refills | Status: AC

## 2022-06-10 MED ORDER — ERYTHROMYCIN 2 % EX PADS: 60.0000 | MEDICATED_PAD | CUTANEOUS | 2 refills | Status: AC

## 2022-06-10 MED ORDER — TERBINAFINE HCL 250 MG PO TABS: 250.0000 mg | ORAL_TABLET | Freq: Every day | ORAL | 0 refills | Status: DC

## 2022-06-10 NOTE — Progress Notes (Signed)
Subjective:  Patient ID: Chad Middleton, male    DOB: Sep 24, 1985,  MRN: [id number]  Chief Complaint  Patient presents with   Toe Pain    36 y.o. male presents with the above complaint. Patient presents with right fourth fifth interdigital space ulceration secondary to superinfection.  They do not recall how long has been present but they noticed about a week ago has progressive gotten worse.  It does not hurt.  They want to get eval make sure it does not get infected he does not want to lose his toe.  He is here with his family.  He denies any other acute complaints   Review of Systems: Negative except as noted in the HPI. Denies N/V/F/Ch.  Past Medical History:  Diagnosis Date   ADHD (attention deficit hyperactivity disorder)    Anxiety    Hypertension    Incontinent of urine    Mental retardation    simple photos will recognize, not likely to verbalize much.   OSA (obstructive sleep apnea)    no cpap use-pt will not use    Current Outpatient Medications:    doxycycline (VIBRA-TABS) 100 MG tablet, Take 1 tablet (100 mg total) by mouth 2 (two) times daily for 14 days., Disp: 28 tablet, Rfl: 0   Erythromycin 2 % PADS, Apply 60 each topically 1 day or 1 dose for 1 dose., Disp: 60 each, Rfl: 2   terbinafine (LAMISIL) 250 MG tablet, Take 1 tablet (250 mg total) by mouth daily., Disp: 30 tablet, Rfl: 0   amLODipine-benazepril (LOTREL) 5-20 MG per capsule, Take 1 capsule by mouth every morning., Disp: , Rfl:    cetirizine (ZYRTEC) 10 MG tablet, Take by mouth., Disp: , Rfl:    divalproex (DEPAKOTE ER) 500 MG 24 hr tablet, Take 500-1,000 mg by mouth 2 (two) times daily. Take '500mg'$  in the morning and take '1000mg'$  at bedtime, Disp: , Rfl:    guanFACINE (TENEX) 1 MG tablet, Take 1 mg by mouth daily., Disp: , Rfl:    hydrochlorothiazide (HYDRODIURIL) 25 MG tablet, Take 25 mg by mouth daily. (Patient not taking: Reported on 04/03/2021), Disp: , Rfl:    loratadine (CLARITIN) 10 MG tablet, Take 10  mg by mouth every morning. , Disp: , Rfl:    meloxicam (MOBIC) 7.5 MG tablet, Take 1 tablet (7.5 mg total) by mouth daily., Disp: 30 tablet, Rfl: 0   protriptyline (VIVACTIL) 5 MG tablet, 1 daily at bedtime (Patient not taking: Reported on 02/28/2019), Disp: 30 tablet, Rfl: 5   risperiDONE (RISPERDAL) 1 MG tablet, Take 1 mg by mouth 2 (two) times daily., Disp: , Rfl:    sertraline (ZOLOFT) 100 MG tablet, Take 200 mg by mouth every morning., Disp: , Rfl:    theophylline (UNIPHYL) 400 MG 24 hr tablet, Take 1/2 tablet by mouth daily, Disp: 30 tablet, Rfl: 5  Social History   Tobacco Use  Smoking Status Never  Smokeless Tobacco Never    No Known Allergies Objective:  There were no vitals filed for this visit. There is no height or weight on file to calculate BMI. Constitutional Well developed. Well nourished.  Vascular Dorsalis pedis pulses palpable bilaterally. Posterior tibial pulses palpable bilaterally. Capillary refill normal to all digits.  No cyanosis or clubbing noted. Pedal hair growth normal.  Neurologic Normal speech. Oriented to person, place, and time. Epicritic sensation to light touch grossly present bilaterally.  Dermatologic Right fourth fifth interdigital space maceration with underlying superinfection.  Wound probing down to fat layer  not down to the bone.  No redness noted.  No purulent drainage noted.  Orthopedic: Normal joint ROM without pain or crepitus bilaterally. No visible deformities. No bony tenderness.   Radiographs: None Assessment:   1. Superinfection   2. Skin maceration    Plan:  Patient was evaluated and treated and all questions answered.  Right fourth fifth interdigital space superinfection -I will [place] and concerns were discussed with the patient in extensive detail.  Given the amount of macerated tissue as well as ulceration noted I believe patient will benefit from doxycycline for 14 days as well as Lamisil for 30 days to help orally  with infection control as well as erythromycin pads in the interdigital space. -If there is no improvement we will discuss Betadine wet-to-dry  No follow-ups on file.

## 2022-06-17 ENCOUNTER — Telehealth: Payer: Self-pay | Admitting: *Deleted

## 2022-06-17 MED ORDER — ERYTHROMYCIN 2 % EX GEL: Freq: Every day | CUTANEOUS | 0 refills | Status: DC

## 2022-06-17 NOTE — Telephone Encounter (Signed)
Patient's mother is calling to ask for a prescription for erythromycin cream to be sent, unable to find the pads, has tried 3 pharmacies, Please advise.

## 2022-06-18 NOTE — Telephone Encounter (Signed)
Patient's mom notified.

## 2022-07-08 ENCOUNTER — Other Ambulatory Visit: Payer: Self-pay | Admitting: Podiatry

## 2022-07-22 ENCOUNTER — Ambulatory Visit (INDEPENDENT_AMBULATORY_CARE_PROVIDER_SITE_OTHER): Payer: Medicare Other | Admitting: Podiatry

## 2022-07-22 DIAGNOSIS — B999 Unspecified infectious disease: Secondary | ICD-10-CM

## 2022-07-22 DIAGNOSIS — L988 Other specified disorders of the skin and subcutaneous tissue: Secondary | ICD-10-CM

## 2022-07-22 NOTE — Progress Notes (Signed)
Subjective:  Patient ID: Chad Middleton, male    DOB: 16-May-1986,  MRN: [id number]  Chief Complaint  Patient presents with   Toe Pain    5th toe superinfection follow up     36 y.o. male presents with the above complaint.  Patient presents with right fourth and fifth interdigital space ulceration with superinfection.  He states is looking little better he completed antibiotic course and antifungal course.  They were not able to get erythromycin pads.  They would like to discuss next treatment plan   Review of Systems: Negative except as noted in the HPI. Denies N/V/F/Ch.  Past Medical History:  Diagnosis Date   ADHD (attention deficit hyperactivity disorder)    Anxiety    Hypertension    Incontinent of urine    Mental retardation    simple photos will recognize, not likely to verbalize much.   OSA (obstructive sleep apnea)    no cpap use-pt will not use    Current Outpatient Medications:    amLODipine-benazepril (LOTREL) 5-20 MG per capsule, Take 1 capsule by mouth every morning., Disp: , Rfl:    cetirizine (ZYRTEC) 10 MG tablet, Take by mouth., Disp: , Rfl:    divalproex (DEPAKOTE ER) 500 MG 24 hr tablet, Take 500-1,000 mg by mouth 2 (two) times daily. Take '500mg'$  in the morning and take '1000mg'$  at bedtime, Disp: , Rfl:    erythromycin with ethanol (EMGEL) 2 % gel, APPLY TO AFFECTED AREA TOPICALLY EVERY DAY, Disp: 30 g, Rfl: 0   guanFACINE (TENEX) 1 MG tablet, Take 1 mg by mouth daily., Disp: , Rfl:    hydrochlorothiazide (HYDRODIURIL) 25 MG tablet, Take 25 mg by mouth daily. (Patient not taking: Reported on 04/03/2021), Disp: , Rfl:    loratadine (CLARITIN) 10 MG tablet, Take 10 mg by mouth every morning. , Disp: , Rfl:    meloxicam (MOBIC) 7.5 MG tablet, Take 1 tablet (7.5 mg total) by mouth daily., Disp: 30 tablet, Rfl: 0   protriptyline (VIVACTIL) 5 MG tablet, 1 daily at bedtime (Patient not taking: Reported on 02/28/2019), Disp: 30 tablet, Rfl: 5   risperiDONE (RISPERDAL) 1 MG  tablet, Take 1 mg by mouth 2 (two) times daily., Disp: , Rfl:    sertraline (ZOLOFT) 100 MG tablet, Take 200 mg by mouth every morning., Disp: , Rfl:    theophylline (UNIPHYL) 400 MG 24 hr tablet, Take 1/2 tablet by mouth daily, Disp: 30 tablet, Rfl: 5  Social History   Tobacco Use  Smoking Status Never  Smokeless Tobacco Never    No Known Allergies Objective:  There were no vitals filed for this visit. There is no height or weight on file to calculate BMI. Constitutional Well developed. Well nourished.  Vascular Dorsalis pedis pulses palpable bilaterally. Posterior tibial pulses palpable bilaterally. Capillary refill normal to all digits.  No cyanosis or clubbing noted. Pedal hair growth normal.  Neurologic Normal speech. Oriented to person, place, and time. Epicritic sensation to light touch grossly present bilaterally.  Dermatologic Right fourth fifth interdigital space maceration with underlying superinfection.  Wound probing down to fat layer not down to the bone.  No redness noted.  No purulent drainage noted.  Orthopedic: Normal joint ROM without pain or crepitus bilaterally. No visible deformities. No bony tenderness.   Radiographs: None Assessment:   1. Superinfection   2. Skin maceration     Plan:  Patient was evaluated and treated and all questions answered.  Right fourth fifth interdigital space superinfection -I will [place]  and concerns were discussed with the patient in extensive detail.   -Clinically finished doxycycline and Lamisil which helped.  However as they were not able to get erythromycin pads.  Therefore I believe patient will benefit from Betadine wet-to-dry dressing advised him to do it every single day for next 6 weeks.  They state understanding will do so.  No follow-ups on file.

## 2022-07-23 ENCOUNTER — Other Ambulatory Visit: Payer: Self-pay | Admitting: Podiatry

## 2022-07-24 NOTE — Telephone Encounter (Signed)
Please advise 

## 2022-08-15 ENCOUNTER — Other Ambulatory Visit: Payer: Self-pay | Admitting: Podiatry

## 2022-09-02 ENCOUNTER — Ambulatory Visit (INDEPENDENT_AMBULATORY_CARE_PROVIDER_SITE_OTHER): Payer: Medicare Other | Admitting: Podiatry

## 2022-09-02 DIAGNOSIS — L988 Other specified disorders of the skin and subcutaneous tissue: Secondary | ICD-10-CM | POA: Diagnosis not present

## 2022-09-02 DIAGNOSIS — B999 Unspecified infectious disease: Secondary | ICD-10-CM

## 2022-09-02 NOTE — Progress Notes (Signed)
Subjective:  Patient ID: Chad Middleton, male    DOB: 1986/06/05,  MRN: [id number]  Chief Complaint  Patient presents with   Toe Pain    5th toe superinfection follow up     36 y.o. male presents with the above complaint.  Patient presents with follow-up of right fourth and fifth interdigital space superinfection.  He states is doing a lot better denies any other acute complaints admitted Betadine wet-to-dry     Review of Systems: Negative except as noted in the HPI. Denies N/V/F/Ch.  Past Medical History:  Diagnosis Date   ADHD (attention deficit hyperactivity disorder)    Anxiety    Hypertension    Incontinent of urine    Mental retardation    simple photos will recognize, not likely to verbalize much.   OSA (obstructive sleep apnea)    no cpap use-pt will not use    Current Outpatient Medications:    amLODipine-benazepril (LOTREL) 5-20 MG per capsule, Take 1 capsule by mouth every morning., Disp: , Rfl:    cetirizine (ZYRTEC) 10 MG tablet, Take by mouth., Disp: , Rfl:    divalproex (DEPAKOTE ER) 500 MG 24 hr tablet, Take 500-1,000 mg by mouth 2 (two) times daily. Take '500mg'$  in the morning and take '1000mg'$  at bedtime, Disp: , Rfl:    erythromycin with ethanol (EMGEL) 2 % gel, APPLY TO AFFECTED AREA TOPICALLY EVERY DAY, Disp: 30 g, Rfl: 0   guanFACINE (TENEX) 1 MG tablet, Take 1 mg by mouth daily., Disp: , Rfl:    hydrochlorothiazide (HYDRODIURIL) 25 MG tablet, Take 25 mg by mouth daily. (Patient not taking: Reported on 04/03/2021), Disp: , Rfl:    loratadine (CLARITIN) 10 MG tablet, Take 10 mg by mouth every morning. , Disp: , Rfl:    meloxicam (MOBIC) 7.5 MG tablet, Take 1 tablet (7.5 mg total) by mouth daily., Disp: 30 tablet, Rfl: 0   protriptyline (VIVACTIL) 5 MG tablet, 1 daily at bedtime (Patient not taking: Reported on 02/28/2019), Disp: 30 tablet, Rfl: 5   risperiDONE (RISPERDAL) 1 MG tablet, Take 1 mg by mouth 2 (two) times daily., Disp: , Rfl:    sertraline (ZOLOFT)  100 MG tablet, Take 200 mg by mouth every morning., Disp: , Rfl:    theophylline (UNIPHYL) 400 MG 24 hr tablet, Take 1/2 tablet by mouth daily, Disp: 30 tablet, Rfl: 5  Social History   Tobacco Use  Smoking Status Never  Smokeless Tobacco Never    No Known Allergies Objective:  There were no vitals filed for this visit. There is no height or weight on file to calculate BMI. Constitutional Well developed. Well nourished.  Vascular Dorsalis pedis pulses palpable bilaterally. Posterior tibial pulses palpable bilaterally. Capillary refill normal to all digits.  No cyanosis or clubbing noted. Pedal hair growth normal.  Neurologic Normal speech. Oriented to person, place, and time. Epicritic sensation to light touch grossly present bilaterally.  Dermatologic No further right fourth fifth interdigital space maceration without underlying superinfection.  No further wound noted.  No redness noted.  No purulent drainage noted.  Orthopedic: Normal joint ROM without pain or crepitus bilaterally. No visible deformities. No bony tenderness.   Radiographs: None Assessment:   1. Superinfection   2. Skin maceration     Plan:  Patient was evaluated and treated and all questions answered.  Right fourth fifth interdigital space superinfection -Clinically healed.  No further signs of superinfection noted.  Continue Betadine dressing for next few days and then patient can self  discontinue.  I discussed this with the patient and the caretaker.  If any foot and ankle issues arises I have asked him to come back and see me.  He states understanding. No follow-ups on file.

## 2024-05-22 ENCOUNTER — Ambulatory Visit: Payer: Self-pay | Admitting: Dentistry

## 2024-05-22 DIAGNOSIS — F840 Autistic disorder: Secondary | ICD-10-CM

## 2024-05-24 ENCOUNTER — Encounter (HOSPITAL_COMMUNITY): Payer: Self-pay

## 2024-05-24 ENCOUNTER — Other Ambulatory Visit: Payer: Self-pay

## 2024-05-24 NOTE — Progress Notes (Signed)
 SDW call  Patient's mom Delores was given pre-op instructions over the phone. She verbalized understanding of instructions provided.She denied patient having any SOB, fever or cough. Patient is special needs and is non-verbal     PCP - Verneita Piety, FNP Cardiologist -  Pulmonary:    PPM/ICD - denies Device Orders - na Rep Notified - na   Chest x-ray - na EKG -  05/10/2024, requested from PCP Stress Test - ECHO - 11/25/2021 Cardiac Cath -   Sleep Study/sleep apnea/CPAP: Diagnosed with sleep apnea, will not wear his CPAP  Non-diabetic  Blood Thinner Instructions: denies Aspirin Instructions:denies   ERAS Protcol - NPO   Anesthesia review: Yes. Special needs, non-verbal, OSA, HTN  Your procedure is scheduled on Thursday May 25, 2024  Report to [facility] Main Entrance A at  0530  A.M., then check in with the Admitting office.  Call this number if you have problems the morning of surgery:  [phone]   If you have any questions prior to your surgery date call [phone]: Open Monday-Friday 8am-4pm If you experience any cold or flu symptoms such as cough, fever, chills, shortness of breath, etc. between now and your scheduled surgery, please notify us  at the above number    Remember:  Do not eat or drink after midnight the night before your surgery  You may drink clear liquids until     the morning of your surgery.   Clear liquids allowed are: Water , Non-Citrus Juices (without pulp), Carbonated Beverages, Clear Tea, Black Coffee ONLY (NO MILK, CREAM OR POWDERED CREAMER of any kind), and Gatorade   Take these medicines the morning of surgery with A SIP OF WATER :  Depakote , singulair, risperidone, zoloft   As needed: Atarax  As of today, STOP taking any Aspirin (unless otherwise instructed by your surgeon) Aleve, Naproxen, Ibuprofen, Motrin, Advil, Goody's, BC's, all herbal medications, fish oil, and all vitamins.

## 2024-05-25 ENCOUNTER — Encounter (HOSPITAL_COMMUNITY)
Admission: RE | Disposition: A | Discharge status: Home / Self Care | Payer: Self-pay | Source: Home / Self Care | Attending: Dentistry

## 2024-05-25 ENCOUNTER — Ambulatory Visit (HOSPITAL_COMMUNITY): Admitting: Anesthesiology

## 2024-05-25 ENCOUNTER — Other Ambulatory Visit: Payer: Self-pay

## 2024-05-25 ENCOUNTER — Encounter (HOSPITAL_COMMUNITY): Payer: Self-pay

## 2024-05-25 ENCOUNTER — Ambulatory Visit (HOSPITAL_COMMUNITY)
Admission: RE | Admit: 2024-05-25 | Discharge: 2024-05-25 | Disposition: A | Discharge status: Home / Self Care | Attending: Dentistry | Admitting: Dentistry

## 2024-05-25 DIAGNOSIS — I10 Essential (primary) hypertension: Secondary | ICD-10-CM

## 2024-05-25 DIAGNOSIS — F79 Unspecified intellectual disabilities: Secondary | ICD-10-CM | POA: Diagnosis not present

## 2024-05-25 DIAGNOSIS — F840 Autistic disorder: Secondary | ICD-10-CM

## 2024-05-25 DIAGNOSIS — K029 Dental caries, unspecified: Secondary | ICD-10-CM | POA: Insufficient documentation

## 2024-05-25 DIAGNOSIS — G4733 Obstructive sleep apnea (adult) (pediatric): Secondary | ICD-10-CM

## 2024-05-25 DIAGNOSIS — G4730 Sleep apnea, unspecified: Secondary | ICD-10-CM | POA: Insufficient documentation

## 2024-05-25 DIAGNOSIS — F419 Anxiety disorder, unspecified: Secondary | ICD-10-CM

## 2024-05-25 HISTORY — PX: TOOTH EXTRACTION: SHX859

## 2024-05-25 MED ORDER — HEMOSTATIC AGENTS (NO CHARGE) OPTIME
TOPICAL | Status: DC | PRN
  Administered 2024-05-25: 1 via TOPICAL

## 2024-05-25 MED ORDER — ACETAMINOPHEN 10 MG/ML IV SOLN
INTRAVENOUS | Status: DC | PRN
Start: 2024-05-25 — End: 2024-05-25
  Administered 2024-05-25: 1000 mg via INTRAVENOUS

## 2024-05-25 MED ORDER — ACETAMINOPHEN 10 MG/ML IV SOLN: 1000.0000 mg | Freq: Once | INTRAVENOUS | Status: DC | PRN

## 2024-05-25 MED ORDER — ONDANSETRON HCL 4 MG/2ML IJ SOLN
INTRAMUSCULAR | Status: DC | PRN
  Administered 2024-05-25: 4 mg via INTRAVENOUS

## 2024-05-25 MED ORDER — FENTANYL CITRATE (PF) 250 MCG/5ML IJ SOLN
INTRAMUSCULAR | Status: AC
  Filled 2024-05-25: qty 5

## 2024-05-25 MED ORDER — CHLORHEXIDINE GLUCONATE 0.12 % MT SOLN
15.0000 mL | Freq: Once | OROMUCOSAL | Status: DC
Start: 2024-05-25 — End: 2024-05-25

## 2024-05-25 MED ORDER — DEXAMETHASONE SODIUM PHOSPHATE 10 MG/ML IJ SOLN
INTRAMUSCULAR | Status: DC | PRN
  Administered 2024-05-25: 10 mg via INTRAVENOUS

## 2024-05-25 MED ORDER — LIDOCAINE-EPINEPHRINE 2 %-1:100000 IJ SOLN
INTRAMUSCULAR | Status: AC
Start: 2024-05-25 — End: 2024-05-25
  Filled 2024-05-25: qty 5.1

## 2024-05-25 MED ORDER — OXYCODONE HCL 5 MG/5ML PO SOLN: 5.0000 mg | Freq: Once | ORAL | Status: DC | PRN

## 2024-05-25 MED ORDER — MIDAZOLAM HCL 2 MG/2ML IJ SOLN
INTRAMUSCULAR | Status: DC | PRN
Start: 2024-05-25 — End: 2024-05-25
  Administered 2024-05-25: 2 mg via INTRAVENOUS

## 2024-05-25 MED ORDER — OXYCODONE HCL 5 MG PO TABS: 5.0000 mg | ORAL_TABLET | Freq: Once | ORAL | Status: DC | PRN

## 2024-05-25 MED ORDER — PROPOFOL 10 MG/ML IV BOLUS
INTRAVENOUS | Status: AC
  Filled 2024-05-25: qty 20

## 2024-05-25 MED ORDER — CEFAZOLIN SODIUM-DEXTROSE 3-4 GM/150ML-% IV SOLN
INTRAVENOUS | Status: AC
  Filled 2024-05-25: qty 150

## 2024-05-25 MED ORDER — LIDOCAINE 2% (20 MG/ML) 5 ML SYRINGE
INTRAMUSCULAR | Status: DC | PRN
  Administered 2024-05-25: 100 mg via INTRAVENOUS

## 2024-05-25 MED ORDER — DEXMEDETOMIDINE HCL IN NACL 80 MCG/20ML IV SOLN
INTRAVENOUS | Status: AC
  Filled 2024-05-25: qty 20

## 2024-05-25 MED ORDER — ROCURONIUM BROMIDE 10 MG/ML (PF) SYRINGE
PREFILLED_SYRINGE | INTRAVENOUS | Status: DC | PRN
  Administered 2024-05-25: 80 mg via INTRAVENOUS

## 2024-05-25 MED ORDER — LIDOCAINE HCL 2 % IJ SOLN
INTRAMUSCULAR | Status: DC | PRN
  Administered 2024-05-25: 5.1 mL

## 2024-05-25 MED ORDER — LACTATED RINGERS IV SOLN
INTRAVENOUS | Status: DC
Start: 2024-05-25 — End: 2024-05-25

## 2024-05-25 MED ORDER — DEXTROSE 5 % IV SOLN
INTRAVENOUS | Status: DC | PRN
  Administered 2024-05-25: 3 g via INTRAVENOUS

## 2024-05-25 MED ORDER — OXYMETAZOLINE HCL 0.05 % NA SOLN
NASAL | Status: DC | PRN
  Administered 2024-05-25: 2 via NASAL

## 2024-05-25 MED ORDER — ONDANSETRON HCL 4 MG/2ML IJ SOLN: 4.0000 mg | Freq: Once | INTRAMUSCULAR | Status: DC | PRN

## 2024-05-25 MED ORDER — FENTANYL CITRATE (PF) 250 MCG/5ML IJ SOLN
INTRAMUSCULAR | Status: DC | PRN
  Administered 2024-05-25: 100 ug via INTRAVENOUS
  Administered 2024-05-25 (×3): 50 ug via INTRAVENOUS

## 2024-05-25 MED ORDER — DEXAMETHASONE SODIUM PHOSPHATE 10 MG/ML IJ SOLN
INTRAMUSCULAR | Status: AC
  Filled 2024-05-25: qty 1

## 2024-05-25 MED ORDER — 0.9 % SODIUM CHLORIDE (POUR BTL) OPTIME
TOPICAL | Status: DC | PRN
  Administered 2024-05-25: 1000 mL

## 2024-05-25 MED ORDER — LABETALOL HCL 5 MG/ML IV SOLN
INTRAVENOUS | Status: DC | PRN
Start: 2024-05-25 — End: 2024-05-25
  Administered 2024-05-25: 5 mg via INTRAVENOUS

## 2024-05-25 MED ORDER — KETOROLAC TROMETHAMINE 30 MG/ML IJ SOLN
INTRAMUSCULAR | Status: DC | PRN
  Administered 2024-05-25: 30 mg via INTRAVENOUS

## 2024-05-25 MED ORDER — LIDOCAINE 2% (20 MG/ML) 5 ML SYRINGE
INTRAMUSCULAR | Status: AC
  Filled 2024-05-25: qty 5

## 2024-05-25 MED ORDER — KETOROLAC TROMETHAMINE 30 MG/ML IJ SOLN
INTRAMUSCULAR | Status: AC
Start: 2024-05-25 — End: 2024-05-25
  Filled 2024-05-25: qty 1

## 2024-05-25 MED ORDER — OXYMETAZOLINE HCL 0.05 % NA SOLN
NASAL | Status: DC | PRN
  Administered 2024-05-25: 1 via TOPICAL

## 2024-05-25 MED ORDER — ROCURONIUM BROMIDE 10 MG/ML (PF) SYRINGE
PREFILLED_SYRINGE | INTRAVENOUS | Status: AC
  Filled 2024-05-25: qty 10

## 2024-05-25 MED ORDER — ORAL CARE MOUTH RINSE: 15.0000 mL | Freq: Once | OROMUCOSAL | Status: DC

## 2024-05-25 MED ORDER — DEXMEDETOMIDINE HCL IN NACL 80 MCG/20ML IV SOLN
INTRAVENOUS | Status: DC | PRN
  Administered 2024-05-25 (×2): 8 ug via INTRAVENOUS
  Administered 2024-05-25: 4 ug via INTRAVENOUS
  Administered 2024-05-25: 8 ug via INTRAVENOUS

## 2024-05-25 MED ORDER — PROPOFOL 10 MG/ML IV BOLUS
INTRAVENOUS | Status: DC | PRN
  Administered 2024-05-25: 200 mg via INTRAVENOUS

## 2024-05-25 MED ORDER — SUGAMMADEX SODIUM 200 MG/2ML IV SOLN
INTRAVENOUS | Status: DC | PRN
  Administered 2024-05-25: 200 mg via INTRAVENOUS

## 2024-05-25 MED ORDER — FENTANYL CITRATE (PF) 100 MCG/2ML IJ SOLN: 25.0000 ug | INTRAMUSCULAR | Status: DC | PRN

## 2024-05-25 MED ORDER — MIDAZOLAM HCL 2 MG/2ML IJ SOLN
INTRAMUSCULAR | Status: AC
  Filled 2024-05-25: qty 2

## 2024-05-25 MED ORDER — ONDANSETRON HCL 4 MG/2ML IJ SOLN
INTRAMUSCULAR | Status: AC
  Filled 2024-05-25: qty 2

## 2024-05-25 MED ORDER — STERILE WATER FOR IRRIGATION IR SOLN
Status: DC | PRN
  Administered 2024-05-25: 1000 mL

## 2024-05-25 SURGICAL SUPPLY — 1 items: NDL DENTAL 27 LONG (NEEDLE) ×1 IMPLANT

## 2024-05-25 NOTE — Anesthesia Procedure Notes (Addendum)
 Procedure Name: Intubation Date/Time: 05/25/2024 7:37 AM  Performed by: Jolynn Mage, CRNAPre-anesthesia Checklist: Patient identified, Emergency Drugs available, Suction available and Patient being monitored Patient Re-evaluated:Patient Re-evaluated prior to induction Oxygen Delivery Method: Circle system utilized Preoxygenation: Pre-oxygenation with 100% oxygen Induction Type: IV induction Ventilation: Mask ventilation without difficulty Laryngoscope Size: Mac, Glidescope and 4 Grade View: Grade I Nasal Tubes: Nasal prep performed, Nasal Rae and Left Tube size: 7.5 mm Number of attempts: 1 Airway Equipment and Method: Video-laryngoscopy Placement Confirmation: ETT inserted through vocal cords under direct vision, positive ETCO2 and breath sounds checked- equal and bilateral Tube secured with: Tape Dental Injury: Teeth and Oropharynx as per pre-operative assessment  Difficulty Due To: Difficult Airway- due to large tongue and Difficult Airway- due to dentition

## 2024-05-25 NOTE — Anesthesia Postprocedure Evaluation (Signed)
 Anesthesia Post Note  Patient: Chad Middleton  Procedure(s) Performed: DENTAL RESTORATION/EXTRACTIONS/ X-RAY/ CLEANING (Mouth)     Patient location during evaluation: PACU Anesthesia Type: General Level of consciousness: awake and alert Pain management: pain level controlled Vital Signs Assessment: post-procedure vital signs reviewed and stable Respiratory status: spontaneous breathing, nonlabored ventilation, respiratory function stable and patient connected to nasal cannula oxygen Cardiovascular status: blood pressure returned to baseline and stable Postop Assessment: no apparent nausea or vomiting Anesthetic complications: no   No notable events documented.  Last Vitals:  Vitals:   05/25/24 1100 05/25/24 1115  BP: 132/74 (!) 126/92  Pulse: 96 97  Resp: 19 15  Temp:  (!) 36.3 C  SpO2: 94% 94%    Last Pain:  Vitals:   05/25/24 9391  TempSrc: Oral                 [name]

## 2024-05-25 NOTE — Op Note (Signed)
 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 Dental Care provided in OR for medically necessary treatment.  Surgeon: Margarito VEAR Councilman, DMD  Assistant: Merlynn Grimes and hospital staff.  Anesthesia: General  Procedure: The patient was brought into the operating room and placed on the table in a supine position.  General anesthesia was administered via nasal intubation.  The patient was prepped and draped in the usual manner for an intra-oral general dentistry procedure. The oropharynx was suctioned and a moistened oropharyngeal throat pack was placed.    A full intra-oral exam including all hard and soft tissues was performed.  Type of Exam: Recall   Soft Tissue Exam: Floor of the mouth: Normal Buccal mucosa: Normal Soft palate: Normal Hard palate: Normal Tongue: Normal Gingival: Inflammed Frenum: Normal  Hard tissue exam:  Present: # 1-28, T, 30-32 Missing: # 29 Un-erupted: # 16 Radiographic findings decay: # 19, 20, T, 30   Full mouth series of digital radiographs taken and reviewed. A comprehensive treatment plan was developed.  Operative care was accomplished in a standard fashion using high/low speed drills with copious irrigation.  Routine extractions were accomplished with simple elevation and use of forceps. Surgical Extractions were done in a standard fashion with full facial thickness flaps to gain access, otectomy / osteoplasty with copious irrigation to expose the teeth. Teeth with multi-roots were sectioned as needed to minimize surgical trauma.   All surgical sites were irrigated with copious amounts of saline. Gel foam was placed in the sockets and hemostasis established with firm pressure. Surgical sites were closed with 3-0 Chromic sutures.  Local Anes:Lidocaine  2% with 1:100,000 epinephrine  5.1 mls The estimated blood loss was 10 mls.    Upon completion of all procedures the oropharynx was irrigated of all debris. Mouth was suctioned dry and a posterior throat pack was carefully removed with constant suction. Hemostasis was established and a gauze pack was placed as an intraoral pressure dressing. After spontaneous respirations the patient was extubated and transported to the Post-Anesthesia care unit in awake but in a sedated condition. The patient tolerated the procedure well and without complications.  An explanation of procedures and extractions were given to parents.  Operative Procedures:  Full mouth debridement: Yes Extractions completed: # 1, 17, 19, 20, T, 30, 32 Glass Ionomers: # 14O (coronal/chewing/dentin), 18O (coronal/chewing/dentin), 31O (coronal/chewing/dentin) Fluoride varnish: Yes   Postoperative Meds:  Ibuprofen 600mg  and tylenol  500mg  every 6 hours if needed for pain.  Postoperative Instructions: Extraction sheet signed and given to patient representative.    Margarito VEAR Councilman, DMD

## 2024-05-25 NOTE — Brief Op Note (Signed)
 05/25/2024  10:34 AM  PATIENT:  Chad Middleton  38 y.o. male  PRE-OPERATIVE DIAGNOSIS:  dental caries  POST-OPERATIVE DIAGNOSIS:  dental caries  PROCEDURE:  Procedure(s) with comments: DENTAL RESTORATION/EXTRACTIONS/ X-RAY/ CLEANING (N/A) - EXTRACTION OF TEETH #1, #17, #19, #20, T, #30, and #32  FILLING on 14O, 18O, and 31O  SURGEON:  Surgeons and Role:    * Yarisa Lynam H, DMD - Primary  PHYSICIAN ASSISTANT:   ASSISTANTS: Merlynn Grimes   ANESTHESIA:   general  EBL:  10 mL   BLOOD ADMINISTERED:none  DRAINS: none   LOCAL MEDICATIONS USED:  LIDOCAINE   and Amount: 5.1 ml  SPECIMEN:  No Specimen  DISPOSITION OF SPECIMEN:  N/A  COUNTS:  YES  TOURNIQUET:  * No tourniquets in log *  DICTATION: .Note written in EPIC  PLAN OF CARE: Discharge to home after PACU  PATIENT DISPOSITION:  PACU - hemodynamically stable.   Delay start of Pharmacological VTE agent (>24hrs) due to surgical blood loss or risk of bleeding: not applicable

## 2024-05-25 NOTE — Anesthesia Preprocedure Evaluation (Signed)
 Anesthesia Evaluation  Patient identified by MRN, date of birth, ID band Patient awake    Reviewed: Allergy & Precautions, NPO status , Patient's Chart, lab work & pertinent test results  History of Anesthesia Complications Negative for: history of anesthetic complications  Airway Mallampati: Unable to assess       Dental  (+) Poor Dentition   Pulmonary sleep apnea , neg COPD   breath sounds clear to auscultation       Cardiovascular hypertension, (-) angina (-) CAD, (-) Past MI and (-) Cardiac Stents  Rhythm:Regular Rate:Normal     Neuro/Psych  PSYCHIATRIC DISORDERS Anxiety     Intellectual disability    GI/Hepatic   Endo/Other    Renal/GU      Musculoskeletal   Abdominal   Peds  Hematology   Anesthesia Other Findings   Reproductive/Obstetrics                              Anesthesia Physical Anesthesia Plan  ASA: 3  Anesthesia Plan: General   Post-op Pain Management:    Induction: Intravenous  PONV Risk Score and Plan: 2  Airway Management Planned: Nasal ETT and Video Laryngoscope Planned  Additional Equipment:   Intra-op Plan:   Post-operative Plan: Extubation in OR  Informed Consent: I have reviewed the patients History and Physical, chart, labs and discussed the procedure including the risks, benefits and alternatives for the proposed anesthesia with the patient or authorized representative who has indicated his/her understanding and acceptance.     Dental advisory given and Consent reviewed with POA  Plan Discussed with: CRNA  Anesthesia Plan Comments:          Anesthesia Quick Evaluation

## 2024-05-25 NOTE — Transfer of Care (Signed)
 Immediate Anesthesia Transfer of Care Note  Patient: Chad Middleton  Procedure(s) Performed: DENTAL RESTORATION/EXTRACTIONS/ X-RAY/ CLEANING (Mouth)  Patient Location: PACU  Anesthesia Type:General  Level of Consciousness: drowsy and responds to stimulation  Airway & Oxygen Therapy: Patient Spontanous Breathing and Patient connected to face mask oxygen  Post-op Assessment: Report given to RN, Post -op Vital signs reviewed and stable, and Patient moving all extremities X 4  Post vital signs: Reviewed and stable  Last Vitals:  Vitals Value Taken Time  BP 131/87 05/25/24 10:47  Temp 36.4 C 05/25/24 10:41  Pulse 94 05/25/24 10:50  Resp 17 05/25/24 10:50  SpO2 100 % 05/25/24 10:50  Vitals shown include unfiled device data.  Last Pain:  Vitals:   05/25/24 0608  TempSrc: Oral         Complications: No notable events documented.

## 2024-05-25 NOTE — H&P (Signed)
 H&P reviewed. Stable for surgery Chad Middleton, DMD

## 2024-05-26 ENCOUNTER — Encounter (HOSPITAL_COMMUNITY): Payer: Self-pay | Admitting: Dentistry
